# Patient Record
Sex: Female | Born: 1970 | Race: White | Hispanic: Yes | Marital: Married | Smoking: Never smoker
Health system: Southern US, Community
[De-identification: ages and names within clinical notes are randomized; demographics above are authoritative.]

## PROBLEM LIST (undated history)

## (undated) DIAGNOSIS — E119 Type 2 diabetes mellitus without complications: Secondary | ICD-10-CM

## (undated) DIAGNOSIS — O24419 Gestational diabetes mellitus in pregnancy, unspecified control: Secondary | ICD-10-CM

## (undated) DIAGNOSIS — E785 Hyperlipidemia, unspecified: Secondary | ICD-10-CM

## (undated) DIAGNOSIS — K760 Fatty (change of) liver, not elsewhere classified: Secondary | ICD-10-CM

## (undated) DIAGNOSIS — I10 Essential (primary) hypertension: Secondary | ICD-10-CM

## (undated) DIAGNOSIS — K8020 Calculus of gallbladder without cholecystitis without obstruction: Secondary | ICD-10-CM

## (undated) HISTORY — DX: Essential (primary) hypertension: I10

## (undated) HISTORY — DX: Fatty (change of) liver, not elsewhere classified: K76.0

## (undated) HISTORY — DX: Hyperlipidemia, unspecified: E78.5

## (undated) HISTORY — DX: Type 2 diabetes mellitus without complications: E11.9

## (undated) HISTORY — DX: Calculus of gallbladder without cholecystitis without obstruction: K80.20

## (undated) HISTORY — DX: Gestational diabetes mellitus in pregnancy, unspecified control: O24.419

---

## 1999-10-11 ENCOUNTER — Inpatient Hospital Stay (HOSPITAL_COMMUNITY): Admission: AD | Admit: 1999-10-11 | Discharge: 1999-10-13 | Payer: Self-pay | Admitting: Obstetrics

## 2000-10-30 ENCOUNTER — Emergency Department (HOSPITAL_COMMUNITY): Admission: EM | Admit: 2000-10-30 | Discharge: 2000-10-30 | Payer: Self-pay | Admitting: *Deleted

## 2000-10-31 ENCOUNTER — Encounter: Payer: Self-pay | Admitting: *Deleted

## 2000-12-28 ENCOUNTER — Ambulatory Visit (HOSPITAL_COMMUNITY): Admission: RE | Admit: 2000-12-28 | Discharge: 2000-12-28 | Payer: Self-pay | Admitting: Gastroenterology

## 2001-06-23 ENCOUNTER — Other Ambulatory Visit: Admission: RE | Admit: 2001-06-23 | Discharge: 2001-06-23 | Payer: Self-pay | Admitting: Obstetrics and Gynecology

## 2001-09-28 ENCOUNTER — Inpatient Hospital Stay (HOSPITAL_COMMUNITY): Admission: AD | Admit: 2001-09-28 | Discharge: 2001-09-29 | Payer: Self-pay | Admitting: Obstetrics and Gynecology

## 2004-12-05 ENCOUNTER — Inpatient Hospital Stay (HOSPITAL_COMMUNITY): Admission: AD | Admit: 2004-12-05 | Discharge: 2004-12-07 | Payer: Self-pay | Admitting: Obstetrics

## 2010-09-23 ENCOUNTER — Emergency Department (HOSPITAL_COMMUNITY): Payer: Self-pay

## 2010-09-23 ENCOUNTER — Emergency Department (HOSPITAL_COMMUNITY)
Admission: EM | Admit: 2010-09-23 | Discharge: 2010-09-23 | Disposition: A | Discharge status: Home / Self Care | Payer: Self-pay | Attending: Emergency Medicine | Admitting: Emergency Medicine

## 2010-09-23 DIAGNOSIS — Z79899 Other long term (current) drug therapy: Secondary | ICD-10-CM | POA: Insufficient documentation

## 2010-09-23 DIAGNOSIS — R112 Nausea with vomiting, unspecified: Secondary | ICD-10-CM | POA: Insufficient documentation

## 2010-09-23 DIAGNOSIS — R1013 Epigastric pain: Secondary | ICD-10-CM | POA: Insufficient documentation

## 2010-09-23 DIAGNOSIS — I10 Essential (primary) hypertension: Secondary | ICD-10-CM | POA: Insufficient documentation

## 2010-09-23 DIAGNOSIS — M25519 Pain in unspecified shoulder: Secondary | ICD-10-CM | POA: Insufficient documentation

## 2010-09-23 LAB — COMPREHENSIVE METABOLIC PANEL
ALT: 60 U/L — ABNORMAL HIGH (ref 0–35)
AST: 34 U/L (ref 0–37)
Albumin: 4.1 g/dL (ref 3.5–5.2)
Alkaline Phosphatase: 95 U/L (ref 39–117)
BUN: 15 mg/dL (ref 6–23)
CO2: 26 mEq/L (ref 19–32)
Calcium: 9.8 mg/dL (ref 8.4–10.5)
Chloride: 103 mEq/L (ref 96–112)
Creatinine, Ser: 0.57 mg/dL (ref 0.50–1.10)
GFR calc Af Amer: >60 mL/min (ref >60)
GFR calc non Af Amer: >60 mL/min (ref >60)
Glucose, Bld: 136 mg/dL — ABNORMAL HIGH (ref 70–99)
Potassium: 3.3 mEq/L — ABNORMAL LOW (ref 3.5–5.1)
Sodium: 140 mEq/L (ref 135–145)
Total Bilirubin: 2.3 mg/dL — ABNORMAL HIGH (ref 0.3–1.2)
Total Protein: 7.8 g/dL (ref 6.0–8.3)

## 2010-09-23 LAB — CBC
HCT: 42 % (ref 36.0–46.0)
Hemoglobin: 14.4 g/dL (ref 12.0–15.0)
MCH: 31.2 pg (ref 26.0–34.0)
MCHC: 34.3 g/dL (ref 30.0–36.0)
MCV: 90.9 fL (ref 78.0–100.0)
Platelets: 318 10*3/uL (ref 150–400)
RBC: 4.62 MIL/uL (ref 3.87–5.11)
RDW: 12.8 % (ref 11.5–15.5)
WBC: 11.2 10*3/uL — ABNORMAL HIGH (ref 4.0–10.5)

## 2010-09-23 LAB — DIFFERENTIAL
Basophils Absolute: 0 10*3/uL (ref 0.0–0.1)
Basophils Relative: 0 % (ref 0–1)
Eosinophils Absolute: 0.1 10*3/uL (ref 0.0–0.7)
Eosinophils Relative: 1 % (ref 0–5)
Lymphocytes Relative: 30 % (ref 12–46)
Lymphs Abs: 3.3 10*3/uL (ref 0.7–4.0)
Monocytes Absolute: 0.8 10*3/uL (ref 0.1–1.0)
Monocytes Relative: 7 % (ref 3–12)
Neutro Abs: 7 10*3/uL (ref 1.7–7.7)
Neutrophils Relative %: 63 % (ref 43–77)

## 2010-09-23 LAB — LIPASE, BLOOD: Lipase: 32 U/L (ref 11–59)

## 2010-10-04 ENCOUNTER — Emergency Department (HOSPITAL_COMMUNITY): Payer: Self-pay

## 2010-10-04 ENCOUNTER — Emergency Department (HOSPITAL_COMMUNITY)
Admission: EM | Admit: 2010-10-04 | Discharge: 2010-10-04 | Disposition: A | Discharge status: Home / Self Care | Payer: Self-pay | Attending: Emergency Medicine | Admitting: Emergency Medicine

## 2010-10-04 DIAGNOSIS — Z79899 Other long term (current) drug therapy: Secondary | ICD-10-CM | POA: Insufficient documentation

## 2010-10-04 DIAGNOSIS — M25519 Pain in unspecified shoulder: Secondary | ICD-10-CM | POA: Insufficient documentation

## 2010-10-04 DIAGNOSIS — N83209 Unspecified ovarian cyst, unspecified side: Secondary | ICD-10-CM | POA: Insufficient documentation

## 2010-10-04 DIAGNOSIS — I10 Essential (primary) hypertension: Secondary | ICD-10-CM | POA: Insufficient documentation

## 2010-10-04 DIAGNOSIS — R1033 Periumbilical pain: Secondary | ICD-10-CM | POA: Insufficient documentation

## 2010-10-04 DIAGNOSIS — R1110 Vomiting, unspecified: Secondary | ICD-10-CM | POA: Insufficient documentation

## 2010-10-04 LAB — COMPREHENSIVE METABOLIC PANEL
ALT: 62 U/L — ABNORMAL HIGH (ref 0–35)
AST: 41 U/L — ABNORMAL HIGH (ref 0–37)
Albumin: 4.1 g/dL (ref 3.5–5.2)
CO2: 26 mEq/L (ref 19–32)
Calcium: 9.5 mg/dL (ref 8.4–10.5)
Chloride: 99 mEq/L (ref 96–112)
Creatinine, Ser: <0.47 mg/dL — ABNORMAL LOW (ref 0.50–1.10)
Sodium: 136 mEq/L (ref 135–145)
Total Bilirubin: 2.3 mg/dL — ABNORMAL HIGH (ref 0.3–1.2)

## 2010-10-04 LAB — URINALYSIS, ROUTINE W REFLEX MICROSCOPIC
Glucose, UA: NEGATIVE mg/dL
Hgb urine dipstick: NEGATIVE
Leukocytes, UA: NEGATIVE
Protein, ur: NEGATIVE mg/dL
pH: 5.5 (ref 5.0–8.0)

## 2010-10-04 LAB — DIFFERENTIAL
Basophils Absolute: 0 10*3/uL (ref 0.0–0.1)
Basophils Relative: 0 % (ref 0–1)
Eosinophils Absolute: 0.1 10*3/uL (ref 0.0–0.7)
Neutro Abs: 5.2 10*3/uL (ref 1.7–7.7)
Neutrophils Relative %: 57 % (ref 43–77)

## 2010-10-04 LAB — POCT PREGNANCY, URINE: Preg Test, Ur: NEGATIVE

## 2010-10-04 LAB — CBC
Hemoglobin: 14.8 g/dL (ref 12.0–15.0)
Platelets: 251 10*3/uL (ref 150–400)
RBC: 4.68 MIL/uL (ref 3.87–5.11)
WBC: 9 10*3/uL (ref 4.0–10.5)

## 2010-10-04 MED ORDER — IOHEXOL 300 MG/ML  SOLN
100.0000 mL | Freq: Once | INTRAMUSCULAR | Status: AC | PRN
  Administered 2010-10-04: 100 mL via INTRAVENOUS

## 2011-01-12 NOTE — L&D Delivery Note (Signed)
Attestation of Attending Supervision of Resident: Evaluation and management procedures were performed by the Family Medicine Resident under my supervision.  I have seen and examined the patient, reviewed the resident's note and chart, and I agree with the management and plan.  Adalena Abdulla, MD, FACOG Attending Obstetrician & Gynecologist Faculty Practice, [facility]  

## 2011-01-12 NOTE — L&D Delivery Note (Signed)
Delivery Note At 5:31 PM a viable female was delivered via Vaginal, Spontaneous Delivery (Presentation: Left Occiput Anterior).  APGAR:9 , 9; weight pending.   Placenta status: Intact, Spontaneous.  Cord: 3 vessels with the following complications: None.  Cord pH: not drawn.  Anesthesia: None  Episiotomy: None Lacerations: 1st degree, perineal Suture Repair: 3.0 Monocryl Est. Blood Loss (mL): 350  Mom to postpartum.  Baby to nursery-stable.  [name] 12/27/2011, 6:00 PM

## 2011-10-20 ENCOUNTER — Ambulatory Visit (INDEPENDENT_AMBULATORY_CARE_PROVIDER_SITE_OTHER): Payer: Self-pay | Admitting: Advanced Practice Midwife

## 2011-10-20 ENCOUNTER — Encounter: Payer: Self-pay | Admitting: Advanced Practice Midwife

## 2011-10-20 VITALS — BP 145/91 | Temp 96.9°F | Ht 62.0 in | Wt 181.5 lb

## 2011-10-20 DIAGNOSIS — Z3480 Encounter for supervision of other normal pregnancy, unspecified trimester: Secondary | ICD-10-CM

## 2011-10-20 DIAGNOSIS — O0990 Supervision of high risk pregnancy, unspecified, unspecified trimester: Secondary | ICD-10-CM | POA: Insufficient documentation

## 2011-10-20 DIAGNOSIS — O0930 Supervision of pregnancy with insufficient antenatal care, unspecified trimester: Secondary | ICD-10-CM

## 2011-10-20 DIAGNOSIS — IMO0002 Reserved for concepts with insufficient information to code with codable children: Secondary | ICD-10-CM

## 2011-10-20 DIAGNOSIS — O09529 Supervision of elderly multigravida, unspecified trimester: Secondary | ICD-10-CM

## 2011-10-20 LAB — POCT URINALYSIS DIP (DEVICE)
Glucose, UA: NEGATIVE mg/dL
Ketones, ur: NEGATIVE mg/dL
Specific Gravity, Urine: >=1.03 (ref 1.005–1.030)

## 2011-10-20 NOTE — Progress Notes (Signed)
Pulse- 110 BP Recheck 128/79 Weight gain of 11-20lbs  Given new ob packet  Given flu vaccine info.  Declines WIC info.

## 2011-10-20 NOTE — Addendum Note (Signed)
Addended by: Franchot Mimes on: 10/20/2011 10:38 AM   Modules accepted: Orders

## 2011-10-20 NOTE — Progress Notes (Signed)
   Subjective:    Jasmine Vasquez is a [id number] [redacted]w[redacted]d being seen today for her first obstetrical visit.  Her obstetrical history is significant for advanced maternal age and gestational diabetes in one previous pregnancy. Patient does intend to breast feed. Pregnancy history fully reviewed.  Patient reports no complaints.  Filed Vitals:   10/20/11 0832 10/20/11 0836  BP: 145/91   Temp: 96.9 F (36.1 C)   Height:  5\' 2"  (1.575 m)  Weight: 82.328 kg (181 lb 8 oz)     HISTORY: OB History    Grav Para Term Preterm Abortions TAB SAB Ect Mult Living   4 3 3       3      # Outc Date GA Lbr Len/2nd Wgt Sex Del Anes PTL Lv   1 TRM 9/01 [redacted]w[redacted]d  6lb5oz(2.863kg) F SVD None  Yes   2 TRM 9/03 [redacted]w[redacted]d  9lb(4.082kg) F SVD None  Yes   3 TRM 11/06 [redacted]w[redacted]d  6lb5oz(2.863kg) F SVD None  Yes   4 CUR              Past Medical History  Diagnosis Date  . Hypertension   . Gestational diabetes     third pregnancy   History reviewed. No pertinent past surgical history. Family History  Problem Relation Age of Onset  . Hypertension Father   . Hypertension Brother      Exam    Uterus:     Pelvic Exam:    Perineum: No Hemorrhoids, Normal Perineum   Vulva: normal   Vagina:  normal mucosa, normal discharge   pH:    Cervix: multiparous appearance, no bleeding following Pap, no cervical motion tenderness and no lesions   Adnexa: not easily palpable at this gestation   Bony Pelvis: average and gynecoid  System: Breast:  normal appearance, no masses or tenderness   Skin: normal coloration and turgor, no rashes    Neurologic: oriented, normal, normal mood, gait normal; reflexes normal and symmetric   Extremities: normal strength, tone, and muscle mass   HEENT PERRLA   Mouth/Teeth mucous membranes moist, pharynx normal without lesions   Neck supple and no masses   Cardiovascular: regular rate and rhythm   Respiratory:  appears well, vitals normal, no respiratory distress, acyanotic, normal RR, ear  and throat exam is normal, neck free of mass or lymphadenopathy, chest clear, no wheezing, crepitations, rhonchi, normal symmetric air entry   Abdomen: soft, non-tender; bowel sounds normal; no masses,  no organomegaly   Urinary: urethral meatus normal      Assessment:    Pregnancy: [id number] Patient Active Problem List  Diagnosis  . Supervision of normal subsequent pregnancy  . Late prenatal care  . Advanced maternal age (AMA) in pregnancy        Plan:     Initial labs drawn. Prenatal vitamins. Problem list reviewed and updated. Pap, OB panel, Glucose screen done at today's visit Genetic Screening discussed available options including [facility] and [facility] because late to care: declined except [facility].  Ultrasound discussed; fetal survey: ordered.  Follow up in 2 weeks. 75% of 30 min visit spent on counseling and coordination of care.     LEFTWICH-KIRBY, Trenese Haft 10/20/2011

## 2011-10-20 NOTE — Patient Instructions (Signed)
Embarazo  Tercer trimestre  (Pregnancy - Third Trimester) El tercer trimestre del embarazo (los ltimos 3 meses) es el perodo en el cual tanto usted como su beb crecen con ms rapidez. El beb alcanza un largo de aproximadamente 50 cm. y pesa entre 2,700 y 4,500 kg. El beb gana ms tejido graso y est listo para la vida fuera del cuerpo de la madre. Mientras estn en el interior, los bebs tienen perodos de sueo y vigilia, succionan el pulgar y tienen hipo. Quizs sienta pequeas contracciones del tero. Este es el falso trabajo de parto. Tambin se las conoce como contracciones de Braxton-Hicks . Es como una prctica del parto. Los problemas ms habituales de esta etapa del embarazo incluyen mayor dificultad para respirar, hinchazn de las manos y los pies por retencin de lquidos y la necesidad de orinar con ms frecuencia debido a que el tero y el beb presionan sobre la vejiga.  EXAMENES PRENATALES   Durante los exmenes prenatales, deber seguir realizndose anlisis de sangre. Estas pruebas se realizan para controlar su salud y la del beb. Los anlisis de sangre se realizan para conocer los niveles de algunos compuestos de la sangre (hemoglobina). La anemia (bajo nivel de hemoglobina) es frecuente durante el embarazo. Para prevenirla, se administran hierro y vitaminas. Tambin le tomarn nuevas anlisis para descartar diabetes. Podrn repetirle algunas de las pruebas que le hicieron previamente.  En cada visita le medirn el tamao del tero. Esto permite asegurar que el beb se desarrolla adecuadamente, segn la fecha del embarazo.  Le controlarn la presin arterial en cada visita prenatal. Esto es para asegurarse de que no sufre toxemia.  Le harn un anlisis de orina en cada visita prenatal, para descartar infecciones, diabetes y la presencia de protenas.  Tambin en cada visita controlarn su peso. Esto se realiza para asegurarse que aumenta de peso al ritmo indicado y que usted y su  beb evolucionan normalmente.  En algunas ocasiones se realiza una prueba de ultrasonido para confirmar el correcto desarrollo y evolucin del beb. Esta prueba se realiza con ondas sonoras inofensivas para el beb, de modo que el profesional pueda calcular ms precisamente la fecha del parto.  Analice con su mdico los analgsicos y la anestesia que recibir durante el trabajo de parto y el parto.  Comente la posibilidad de que necesite una cesrea y qu anestesia se recibir.  Informe a su mdico si sufre violencia familiar mental o fsica. A veces, se indica la prueba especializada sin estrs, la prueba de tolerancia a las contracciones y el perfil biofsico para asegurarse de que el beb no tiene problemas. El estudio del lquido amnitico que rodea al beb se llama amniocentesis. El lquido amnitico se obtiene introduciendo una aguja en el vientre (abdomen ). En ocasiones se lleva a cabo cerca del final del embarazo, si es necesario inducir a un parto. En este caso se realiza para asegurarse que los pulmones del beb estn lo suficientemente maduros como para que pueda vivir fuera del tero. Si los pulmones no han madurado y es peligroso que el beb nazca, se administrar a la madre una inyeccin de cortisona , 1 a 2 das antes del parto. . Esto ayuda a que los pulmones del beb maduren y sea ms seguro su nacimiento.  CAMBIOS QUE OCURREN EN EL TERCER TRIMESTRE DEL EMBARAZO  Su organismo atravesar numerosos cambios durante el embarazo. Estos pueden variar de una persona a otra. Converse con el profesional que la asiste acerca los cambios que   usted note y que la preocupen.   Durante el ltimo trimestre probablemente sienta un aumento del apetito. Es normal tener "antojos" de ciertas comidas. Esto vara de una persona a otra y de un embarazo a otro.  Podrn aparecer las primeras estras en las caderas, abdomen y mamas. Estos son cambios normales del cuerpo durante el embarazo. No existen  medicamentos ni ejercicios que puedan prevenir estos cambios.  La constipacin puede tratarse con un laxante o agregando fibra a su dieta. Beber grandes cantidades de lquidos, tomar fibras en forma de vegetales, frutas y granos integrales es de gran ayuda.  Tambin es beneficioso practicar actividad fsica. Si ha sido una persona activa hasta el embarazo, podr continuar con la mayora de las actividades durante el mismo. Si ha sido menos activa, puede ser beneficioso que comience con un programa de ejercicios, como realizar caminatas. Consulte con el profesional que la asiste antes de comenzar un programa de ejercicios.  Evite el consumo de cigarrillos, el alcohol, los medicamentos no recetados y las "drogas de la calle" durante el embarazo. Estas sustancias qumicas afectan la formacin y el desarrollo del beb. Evite estas sustancias durante todo el embarazo para asegurar el nacimiento de un beb sano.  Podr sentir dolor de espalda, tener vrices en las venas y hemorroides, o si ya los sufra, pueden empeorar.  Durante el tercer trimestre se cansar con ms facilidad, lo cual es normal.  Los movimientos del beb pueden ser ms fuertes y con ms frecuencia.  Puede que note dificultades para respirar normalmente.  El ombligo puede salir hacia afuera.  A veces sale una secrecin amarilla de las mamas, que se llama calostro.  Podr aparecer una secrecin mucosa con sangre. Esto suele ocurrir entre unos pocos das y una semana antes del parto. INSTRUCCIONES PARA EL CUIDADO EN EL HOGAR   Cumpla con las citas de control. Siga las indicaciones del mdico con respecto al uso de medicamentos, los ejercicios y la dieta.  Durante el embarazo debe obtener nutrientes para usted y para su beb. Consuma alimentos balanceados a intervalos regulares. Elija alimentos como carne, pescado, leche y otros productos lcteos descremados, vegetales, frutas, panes integrales y cereales. El mdico le informar  cul es el aumento de peso ideal.  Las relaciones sexuales pueden continuarse hasta casi el final del embarazo, si no se presentan otros problemas como prdida prematura (antes de tiempo) de lquido amnitico, hemorragia vaginal o dolor en el vientre (abdominal).  Realice actividad fsica todos los das, si no tiene restricciones. Consulte con el profesional que la asiste si no sabe con certeza si determinados ejercicios son seguros. El mayor aumento de peso se producir en los ltimos 2 trimestres del embarazo. El ejercicio ayuda a:  Controlar su peso.  Mantenerse en forma para el trabajo de parto y el parto .  Perder peso despus del parto.  Haga reposo con frecuencia, con las piernas elevadas, o segn lo necesite para evitar los calambres y el dolor de cintura.  Use un buen sostn o como los que se usan para hacer deportes para aliviar la sensibilidad de las mamas. Tambin puede serle til si lo usa mientras duerme. Si pierde calostro, podr utilizar apsitos en el sostn.  No utilice la baera con agua caliente, baos turcos y saunas.  Colquese el cinturn de seguridad cuando conduzca. Este la proteger a usted y al beb en caso de accidente.  Evite comer carne cruda y el contacto con los utensilios y desperdicios de los gatos. Estos elementos   contienen grmenes que pueden causar defectos de nacimiento en el beb.  Es fcil perder algo de orina durante el embarazo. Apretar y fortalecer los msculos de la pelvis la ayudar con este problema. Practique detener la miccin cuando est en el bao. Estos son los mismos msculos que necesita fortalecer. Son tambin los mismos msculos que utiliza cuando trata de evitar despedir gases. Puede practicar apretando estos msculos diez veces, y repetir esto tres veces por da aproximadamente. Una vez que conozca qu msculos debe apretar, no realice estos ejercicios durante la miccin. Puede favorecerle una infeccin si la orina vuelve hacia  atrs.  Pida ayuda si tienen necesidades financieras, teraputicas o nutricionales. El profesional podr ayudarla con respecto a estas necesidades, o derivarla a otros especialistas.  Haga una lista de nmeros telefnicos de emergencia y tngalos disponibles.  Planifique como obtener ayuda de familiares o amigos cuando regrese a casa desde el hospital.  Hacer un ensayo sobre la partida al hospital.  Tome clases prenatales con el padre para entender, practicar y hacer preguntas sobre el trabajo de parto y el alumbramiento.  Preparar la habitacin del beb / busque una guardera.  No viaje fuera de la ciudad a menos que sea absolutamente necesario y con el asesoramiento de su mdico.  Use slo zapatos de tacn bajo o sin tacn para tener mejor equilibrio y evitar cadas. USO DE MEDICAMENTOS Y CONSUMO DE DROGAS DURANTE EL EMBARAZO   Tome las vitaminas apropiadas para esta etapa tal como se le indic. Las vitaminas deben contener un miligramo de cido flico. Guarde todas las vitaminas fuera del alcance de los nios. La ingestin de slo un par de vitaminas o tabletas que contengan hierro pueden ocasionar la muerte en un beb o en un nio pequeo.  Evite el uso de todos los medicamentos, incluyendo hierbas, medicamentos de venta libre, sin receta o que no hayan sido sugeridos por su mdico. Slo tome medicamentos de venta libre o medicamentos recetados para el dolor, el malestar o fiebre como lo indique su mdico. No tome aspirina, ibuprofeno (Motrin, Advil, Nuprin) o naproxeno (Aleve) excepto que su mdico se lo indique.  Infrmele al profesional si consume alguna droga.  El alcohol se relaciona con ciertos defectos congnitos. Incluye el sndrome de alcoholismo fetal. Debe evitar absolutamente el consumo de alcohol, en cualquier forma. El fumar produce baja tasa de natalidad y bebs prematuros.  Las drogas ilegales o de la calle son muy perjudiciales para el beb. Estn absolutamente  prohibidas. Un beb que nace de una madre adicta, ser adicto al nacer. Ese beb tendr los mismos sntomas de abstinencia que un adulto. SOLICITE ATENCIN MDICA SI:  Tiene preguntas o preocupaciones relacionadas con el embarazo. Es mejor que llame para formular las preguntas si no puede esperar hasta la prxima visita, que sentirse preocupada por ellas.  DECISIONES ACERCA DE LA CIRCUNCISIN  Usted puede saber o no cul es el sexo de su beb. Si ya sabe que ser un varn, este es el momento de pensar acerca de la circuncisin. La circuncisin es la extirpacin del prepucio. Esta es la piel que cubre el extremo sensible del pene. No hay un motivo mdico que lo justifique. Generalmente la decisin se toma segn lo que sea popular en ese momento, o segn creencias religiosas. Podr conversar estos temas con su mdico o con el pediatra.  SOLICITE ATENCIN MDICA DE INMEDIATO SI:   La temperatura oral le sube a ms de 102 F (38.9 C) o lo que su mdico le   indique.  Tiene una prdida de lquido por la vagina (canal de parto). Si sospecha una ruptura de las membranas, tmese la temperatura y llame al profesional para informarlo sobre esto.  Observa unas pequeas manchas, una hemorragia vaginal o elimina cogulos. Notifique al profesional acerca de la cantidad y de cuntos apsitos est utilizando.  Presenta un olor desagradable en la secrecin vaginal y observa un cambio en el color, de transparente a blanco.  Ha vomitado durante ms de 24 horas.  Siente escalofros o le sube la fiebre.  Le falta el aire.  Siente ardor al orinar.  Baja o sube ms de 2 libras (900 g), o segn lo indicado por el profesional que la asiste.  Observa que sbitamente se le hinchan el rostro, las manos, los pies o las piernas.  Siente dolor en el vientre (abdominal). Las molestias en el ligamento redondo son una causa benigna frecuente de dolor abdominal durante el embarazo. El profesional que la asiste deber  evaluarla.  Presenta dolor de cabeza intenso que no se alivia.  Tiene problemas visuales, visin doble o borrosa.  Si no siente los movimientos del beb durante ms de 1 hora. Si piensa que el beb no se mueve tanto como lo haca habitualmente, coma algo que contenga azcar y recustese sobre el lado izquierdo durante una hora. El beb debe moverse al menos 4  5 veces por hora. Comunquese inmediatamente si el beb se mueve menos que lo indicado.  Se cae, se ve involucrada en un accidente automovilstico o sufre algn tipo de traumatismo.  En su hogar hay violencia mental o fsica. Document Released: 10/07/2004 Document Revised: 06/29/2011 [employer] Patient Information 2013 [employer].  

## 2011-10-21 LAB — OBSTETRIC PANEL
Antibody Screen: NEGATIVE
Eosinophils Absolute: 0.1 10*3/uL (ref 0.0–0.7)
Eosinophils Relative: 1 % (ref 0–5)
HCT: 39.2 % (ref 36.0–46.0)
Hemoglobin: 13.4 g/dL (ref 12.0–15.0)
Lymphocytes Relative: 14 % (ref 12–46)
Lymphs Abs: 1.5 10*3/uL (ref 0.7–4.0)
MCH: 30.7 pg (ref 26.0–34.0)
MCV: 89.7 fL (ref 78.0–100.0)
Monocytes Absolute: 0.3 10*3/uL (ref 0.1–1.0)
Monocytes Relative: 3 % (ref 3–12)
RBC: 4.37 MIL/uL (ref 3.87–5.11)
Rh Type: POSITIVE
Rubella: 13.4 IU/mL — ABNORMAL HIGH
WBC: 10.8 10*3/uL — ABNORMAL HIGH (ref 4.0–10.5)

## 2011-10-21 LAB — GLUCOSE TOLERANCE, 1 HOUR (50G) W/O FASTING: Glucose, 1 Hour GTT: 195 mg/dL — ABNORMAL HIGH (ref 70–140)

## 2011-10-25 ENCOUNTER — Telehealth: Payer: Self-pay | Admitting: *Deleted

## 2011-10-25 NOTE — Telephone Encounter (Signed)
Message copied by Gerome Apley on Mon Oct 25, 2011  9:18 AM ------      Message from: Odelia Gage A      Created: Mon Oct 25, 2011  7:22 AM       Appointment is Tuesday at 8:00                  ----- Message -----         From: Hurshel Party, CNM         Sent: 10/22/2011  12:23 PM           To: [facility] Admin Pool            Pt had 195 on 1 hour glucose tolerance test.  Please call her to have her come in for 3 hour test done as soon as possible.  We can coordinate regular prenatal visit with lab but she should be seen in next 2 weeks.  Thank you.

## 2011-10-25 NOTE — Telephone Encounter (Signed)
Message sent to admin pool to reschedule patient's 3 hour GTT to Friday morning. Patient has [place] tomorrow that would interfere with 3 hour lab draw. Patient needs to be notified with Spanish interpreter.

## 2011-10-25 NOTE — Telephone Encounter (Signed)
Called patient with Jasmine Vasquez and informed her of lab appointment on Friday at 8:00 am. Patient was advised that 1 hour GTT was elevated and there was a need for the 3 hour appointment. The patient voiced understanding and did not have any further questions.

## 2011-10-25 NOTE — Telephone Encounter (Signed)
Need to call patient with Spanish interpreter.  

## 2011-10-26 ENCOUNTER — Other Ambulatory Visit: Payer: Self-pay

## 2011-10-26 ENCOUNTER — Other Ambulatory Visit: Payer: Self-pay | Admitting: Advanced Practice Midwife

## 2011-10-26 ENCOUNTER — Ambulatory Visit (HOSPITAL_COMMUNITY)
Admission: RE | Admit: 2011-10-26 | Discharge: 2011-10-26 | Disposition: A | Discharge status: Home / Self Care | Payer: Self-pay | Source: Ambulatory Visit | Attending: Advanced Practice Midwife | Admitting: Advanced Practice Midwife

## 2011-10-26 DIAGNOSIS — Z3480 Encounter for supervision of other normal pregnancy, unspecified trimester: Secondary | ICD-10-CM

## 2011-10-26 DIAGNOSIS — O09299 Supervision of pregnancy with other poor reproductive or obstetric history, unspecified trimester: Secondary | ICD-10-CM | POA: Insufficient documentation

## 2011-10-26 DIAGNOSIS — IMO0002 Reserved for concepts with insufficient information to code with codable children: Secondary | ICD-10-CM

## 2011-10-26 DIAGNOSIS — O0930 Supervision of pregnancy with insufficient antenatal care, unspecified trimester: Secondary | ICD-10-CM

## 2011-10-26 DIAGNOSIS — O09529 Supervision of elderly multigravida, unspecified trimester: Secondary | ICD-10-CM | POA: Insufficient documentation

## 2011-10-29 ENCOUNTER — Other Ambulatory Visit: Payer: Self-pay

## 2011-10-29 DIAGNOSIS — R7309 Other abnormal glucose: Secondary | ICD-10-CM

## 2011-10-30 LAB — GLUCOSE TOLERANCE, 3 HOURS: Glucose, GTT - 3 Hour: 115 mg/dL (ref 70–144)

## 2011-11-03 ENCOUNTER — Encounter: Payer: Self-pay | Admitting: Family Medicine

## 2011-11-03 ENCOUNTER — Ambulatory Visit (INDEPENDENT_AMBULATORY_CARE_PROVIDER_SITE_OTHER): Payer: Self-pay | Admitting: Family Medicine

## 2011-11-03 VITALS — BP 138/91 | Temp 97.1°F | Wt 181.0 lb

## 2011-11-03 DIAGNOSIS — O139 Gestational [pregnancy-induced] hypertension without significant proteinuria, unspecified trimester: Secondary | ICD-10-CM

## 2011-11-03 DIAGNOSIS — O99810 Abnormal glucose complicating pregnancy: Secondary | ICD-10-CM

## 2011-11-03 DIAGNOSIS — O169 Unspecified maternal hypertension, unspecified trimester: Secondary | ICD-10-CM

## 2011-11-03 DIAGNOSIS — O24419 Gestational diabetes mellitus in pregnancy, unspecified control: Secondary | ICD-10-CM

## 2011-11-03 DIAGNOSIS — O09529 Supervision of elderly multigravida, unspecified trimester: Secondary | ICD-10-CM

## 2011-11-03 DIAGNOSIS — O0930 Supervision of pregnancy with insufficient antenatal care, unspecified trimester: Secondary | ICD-10-CM

## 2011-11-03 LAB — POCT URINALYSIS DIP (DEVICE)
Protein, ur: 30 mg/dL — AB
Specific Gravity, Urine: 1.01 (ref 1.005–1.030)
Urobilinogen, UA: 0.2 mg/dL (ref 0.0–1.0)

## 2011-11-03 NOTE — Progress Notes (Signed)
Elev BP:  145/91 last visit 138/91 today.  No HA, vision changes, RUQ pain.  Failed 3 hour GTT. Pt had GDM last pregnancy.  PIH labs, 24 hour urine protein ordered. Will bring on Monday. Transfer to [facility] with appt next week to see Diabetic educator, nutritionist. Discussed dietary changes - pt remembers from last pregnancy. Does not have glucometer. Will get set up next visit.

## 2011-11-03 NOTE — Patient Instructions (Signed)
Diabetes mellitus gestacional (Gestational Diabetes Mellitus) La diabetes mellitus gestacional se produce slo durante el embarazo. Aparece cuando el organismo no puede controlar adecuadamente la glucosa (azcar) que aumenta en la sangre despus de comer. Durante el embarazo, se produce una resistencia a la insulina (sensibilidad reducida a la insulina) debido a la liberacin de hormonas por parte de la placenta. Generalmente, el pncreas de una mujer embarazada produce la cantidad suficiente de insulina para vencer esa resistencia. Sin embargo, en la diabetes gestacional, hay insulina pero no cumple su funcin adecuadamente. Si la resistencia es lo suficientemente grave como para que el pncreas no produzca la cantidad de insulina suficiente, la glucosa extra se acumula en la sangre.  QUINES TIENEN RIESGO DE DESARROLLAR DIABETES GESTACIONAL?  Las mujeres con historia de diabetes en la familia.  Las mujeres de ms de 25 aos.  Las que presentan sobrepeso.  Las mujeres que pertenecen a ciertos grupos tnicos (latinas, afroamericanas, norteamericanas nativas, asiticas y las originarias de las islas del Pacfico. QUE PUEDE OCURRIRLE AL BEB? Si el nivel de glucosa en sangre de la madre es demasiado elevado mientras este embarazada, el nivel extra de azcar pasar por el cordn umbilical hacia el beb. Algunos de los problemas del beb pueden ser:  Beb demasiado grande: si el nio recibe demasiada azcar, puede aumentar mucho de peso. Esto puede hacer que sea demasiado grande para nacer por parto normal (vaginal) por lo que ser necesario realizar una cesrea.  Bajo nivel de glucosa (hipoglucemia): el beb produce insulina extra en respuesta a la excesiva cantidad de azcar que obtiene de la madre. Cuando el beb nace y ya no necesita insulina extra, su nivel de azcar en sangre puede disminuir.  Ictericia (coloracin amarillenta de la piel y los ojos): esto es bastante frecuente en los bebs. La  causa es la acumulacin de una sustancia qumica denominada bilirrubina. No siempre es un trastorno grave, pero se observa con frecuencia en los bebs cuyas madres sufren diabetes gestacional. RIESGOS PARA LA MADRE Las mujeres que han sufrido diabetes gestacional pueden tener ms riesgos para algunos problemas como:  Preeclampsia o toxemia, incluyendo problemas con hipertensin arterial. La presin arterial y los niveles de protenas en la orina deben controlarse con frecuencia.  Infecciones  Parto por cesrea.  Aparicin de diabetes tipo 2 en una etapa posterior de la vida. Alrededor del 30% al 50% sufrir diabetes posteriormente, especialmente las que son obesas. DIAGNSTICO Las hormonas que causan resistencia a la insulina tienen su mayor nivel alrededor de las 24 a 28 semanas del embarazo. Si se experimentan sntomas, stos son similares a los sntomas que normalmente aparecen durante el embarazo.  La diabetes mellitus gestacional generalmente se diagnostica por medio de un mtodo en dos partes: 1. Despus de la 24 a 28 semanas de embarazo, la mujer debe beber una solucin que contiene glucosa y realizar un anlisis de sangre. Si el nivel de glucosa es elevado, la realizarn un segundo anlisis. 2. La prueba oral de tolerancia a la glucosa, que dura aproximadamente tres horas. Despus de realizar ayuno durante la noche, se controla nivel de glucosa en sangre. La mujer bebe una solucin que contiene glucosa y le realizan anlisis de glucosa en sangre cada hora. Si la mujer tiene factores de riesgos para la diabetes mellitus gestacional, el mdico podr indicar el anlisis antes de las 24 semanas de embarazo. TRATAMIENTO El tratamiento est dirigido a mantener la glucosa en sangre de la madre en un nivel normal y puede incluir:  La   planificacin de los alimentos.  Recibir insulina u otro medicamento para Sales executive nivel de glucosa en [place].  La prctica de ejercicios.  Llevar un  registro diario de los alimentos que consume.  Control y Engineer, maintenance (IT) de los niveles de glucosa en [place].  Control de los niveles de cetona en la [place], [place] esto ya no se considera necesario en la mayora de los [place]. INSTRUCCIONES PARA EL CUIDADO DOMICILIARIO Mientras est embarazada:  Siga los consejos de su mdico relacionados con los controles prenatales, la planificacin de la comida, la actividad fsica, los [place], vitaminas, los anlisis de sangre y otras pruebas y las actividades fsicas.  Lleve un registro de las comidas, las pruebas de glucosa en sangre y la cantidad de insulina que recibe (si corresponde). Muestre todo al profesional en cada consulta mdica prenatal.  Si sufre diabetes mellitus gestacional, podr tener problemas de hipoglucemia (nivel bajo de glucosa en sangre). Podr sospechar este problema si se siente repentinamente mareada, tiene temblores y/o se siente dbil. Si cree que esto le est ocurriendo, y tiene un medidor de glucosa, mida su nivel de Event organiser. Siga los consejos de su mdico sobre el modo y el momento de tratar su nivel de glucosa en sangre. Generalmente se sigue la regla 15:15 Consuma 15 g de hidratos de carbono, espere 15 minutos y Programmer, systems el nivel de glucosa en [place].Barbara Cower de 15 g de hidratos de carbono son:  1 taza de [employer].   taza de jugo.  3-4 tabletas de glucosa.  5-6 caramelos duros.  1 caja pequea de pasas de uva.   taza de gaseosa comn.  Mantenga una buena higiene para evitar infecciones.  No fume. SOLICITE ATENCIN MDICA SI:  Observa prdida vaginal con o sin picazn.  Se siente ms dbil o cansada que lo habitual.  Primus [place]ravo.  Tiene un aumento de peso repentino, 2,5 kg o ms en una semana.  Pierde peso, 1.5 kg o ms en una semana.  Su nivel de glucosa en sangre es elevado, necesita instrucciones. SOLICITE ATENCIN MDICA DE INMEDIATO SI:  Sufre una cefalea  intensa.  Se marea o pierde el conocimiento  Presenta nuseas o vmitos.  Se siente desorientada confundida.  Sufre convulsiones.  Tiene problemas de visin.  Siente Physiological scientist.  Presenta una hemorragia vaginal abundante.  Tiene contracciones uterinas.  Tiene una prdida importante de lquido por la vagina DESPUS QUE NACE EL BEB:  Concurra a todos los controles de seguimiento y Clinical biochemist los anlisis de sangre segn las indicaciones de su mdico.  Mantenga un estilo de vida saludable para evitar la diabetes en el futuro. Aqu se incluye:  Siga el plan de alimentacin saludable.  Controle su peso.  Practique actividad fsica y descanse lo necesario.  No fume.  Amamante a su beb mientras pueda. Esto disminuir la probabilidad de que usted y su beb sufran diabetes posteriormente. Para ms informacin acerca de la diabetes, visite la pgina web de Holiday representative Diabetes Association: [email or web address].cy. Para ms informacin acerca de la diabetes gestacional cite la pgina web del [employer] of Obstetricians and Gynecologists en: [email or web address]. Document Released: 10/07/2004 Document Revised: 03/22/2011 [facility] Patient Information 2013 [place], [place].  Hipertensin durante el embarazo  (Hypertension During Pregnancy) La hipertensin tambin se denomina presin arterial alta. Puede ocurrir en cualquier momento de la vida y tambin durante el [place]. Cuando se sufre hipertensin, existe una presin extra en el interior de los vasos sanguneos que llevan la sangre desde el  corazn al resto del cuerpo (arterias). La hipertensin durante el embarazo puede causar problemas para usted y el beb. Puede ser que el beb no tenga el peso adecuado al nacer o puede nacer antes de tiempo (prematuro). En los casos muy graves de hipertensin durante el embarazo puede estar en peligro la vida.  Hay diferentes tipos de hipertensin Academic librarian.    Hipertensin crnica. Esto sucede cuando una mujer sufre de hipertensin antes del embarazo y contina durante el mismo.   Hipertensin gestacional. Es cuando se desarrolla la hipertensin Academic librarian.   Preeclampsia o toxemia del embarazo. Es un tipo muy grave de hipertensin que se desarrolla slo Academic librarian. Es una enfermedad que afecta a todo el cuerpo (sistmica) y puede ser muy peligrosa tanto para la madre como para el beb.   La hipertensin gestacional y preeclampsia por lo general desaparecen despus de nacer el beb. La presin arterial generalmente se estabiliza dentro de las 6 semanas. Las mujeres que sufren de hipertensin durante el embarazo tienen una mayor probabilidad de Environmental education officer hipertensin en etapas posteriores de la vida o en embarazos futuros.  ENTENDER LA PRESIN ARTERIAL  La presin arterial hace que se mueva la sangre en el cuerpo. A veces, la fuerza que [employer] sangre es demasiado intensa.   La lectura de la presin arterial se expresa en 2 nmeros y se ve como una fraccin.   El primer nmero es la presin sistlica. Cuando el corazn late, fuerza a que fluya ms sangre por las arterias. La presin dentro de las arterias [place].   El nmero inferior es la presin diastlica. La presin baja [employer] latidos. Eso ocurre cuando el corazn est en reposo.   Usted puede tener hipertensin si:   La presin arterial sistlica es superior a 140.   La presin diastlica es superior a 90.  FACTORES DE RIESGO  Algunos factores favorecen el desarrollo de la hipertensin Academic librarian. Los factores de riesgo son:   Sufrir hipertensin antes del Psychiatrist.   Haber sufrido hipertensin durante un embarazo anterior.   Tener sobrepeso.   Ser mayor de 40 aos.   Estar embarazada de ms de un beb (mltiples).   Tener diabetes o problemas renales.  SNTOMAS  La hipertensin gestacional y crnica pueden no causar sntomas. La preeclampsia  causa sntomas, que pueden ser:   Aumento de las protenas en la orina. El mdico va a controlar esto en cada control prenatal.   Hinchazn de las manos y la cara.   Aumento rpido de [place].   Dolores de [place].   Cambios visuales.   Molestias al ver la luz.   Dolor abdominal, especialmente en el rea superior derecha.   Dolor en el pecho.   Falta de aire.   Aumento de los reflejos.   Convulsiones. Las convulsiones ocurren en una forma ms grave de preeclampsia, llamada eclampsia.  DIAGNSTICO   Puede ser diagnosticada con hipertensin en el embarazo durante un control prenatal regular. En cada visita, las pruebas pueden ser:   Control de la presin arterial.   Anlisis de orina para detectar protenas en la orina.   El tipo de hipertensin que se diagnostica depende del momento en que se desarroll. Tambin depende de la lectura de su presin arterial especfica.   El desarrollo de hipertensin antes de las 20 semanas de embarazo es consistente con hipertensin crnica.   El desarrollo de la hipertensin despus de las 20 semanas de embarazo es consistente con  hipertensin gestacional.   Hipertensin con aumento de la protena urinaria se diagnostica como preeclampsia.   Las mediciones de la presin arterial de ms de 160 sistlica o 110 diastlica son un signo de preeclampsia grave.  TRATAMIENTO  El tratamiento para la hipertensin durante el embarazo vara. Depende del tipo de hipertensin y de su gravedad.   Si toma medicamentos para la hipertensin crnica, puede que tenga que cambiarlos.   Los medicamentos llamados inhibidores de la ECA no deben tomarse Academic librarian.   Para las mujeres que tienen factores de riesgo de preeclampsia pueden recomendarse bajas dosis de aspirina.   Si usted tiene Occupational hygienist, tendr que tomar un medicamento para la presin arterial que sea seguro durante el [place]. Su mdico le Paediatric nurse  apropiado.   Si tiene preeclampsia grave, es posible que tenga que Engineer, maintenance hospital. Los mdicos la controlarn a usted y al beb muy de cerca. Puede ser que necesite tomar medicamentos (sulfato de magnesio) para prevenir las convulsiones y reducir la presin arterial.   A veces es necesario un parto prematuro. Este puede ser el caso si el problema [place]. Se hace para protegerlos a usted y a su beb. La nica cura para la preeclampsia es el parto.  INSTRUCCIONES PARA EL CUIDADO EN EL HOGAR   Cumpla con todos los controles prenatales regulares.   Siga las indicaciones del profesional con respecto a Adult nurse. Dgale a su mdico sobre todos los [employer] toma. Incluya los medicamentos de [place].   Consuma la menor cantidad posible de sal.   Realice actividad fsica con regularidad.   No beba alcohol.   No use productos que contengan tabaco.   No tome bebidas con cafena.   Acustese sobre el lado izquierdo cuando haga reposo.   Informe a su mdico si tiene sntomas de preeclampsia.  SOLICITE ATENCIN MDICA DE INMEDIATO SI:   Siente un dolor abdominal intenso.   Observa hinchazn repentina y [employer], tobillos o el rostro.   Aumento de peso de ms de 4 libras (1,8 kg) o ms en una semana.   Vomita repetidas veces.   Presenta una hemorragia vaginal abundante.   No siente los movimientos del beb.   Sufre una cefalea grave.   Tiene visin doble o borrosa.   Tiene calambres o espasmos musculares.   Le falta el aire.   Tiene las yemas de los dedos y los labios [place].   Observa sangre en la orina.  ASEGRESE DE QUE:   Comprende estas instrucciones.   Controlar su enfermedad.   Solicitar ayuda de inmediato si no mejora o si empeora.  Document Released: 12/17/2010 [facility] Patient Information 2012 [place], [place].  Embarazo  Systems analyst trimestre  (Pregnancy - Third Trimester) El tercer trimestre del Psychiatrist (los  ltimos 3 meses) es el perodo en el cual tanto usted como su beb crecen con ms rapidez. El beb alcanza un largo de aproximadamente 50 cm. y pesa entre 2,700 y 4,500 kg. El beb gana ms tejido graso y est listo para la vida fuera del cuerpo de la [place]. Mientras estn en el interior, los bebs tienen perodos de sueo y vigilia, Warehouse manager y tienen hipo. Quizs sienta pequeas contracciones del tero. Este es el falso trabajo de [place]. Tambin se las conoce como contracciones de [place] . Es como una prctica del parto. Los problemas ms habituales de esta etapa del embarazo incluyen mayor dificultad para respirar, hinchazn de las  manos y los pies por retencin de lquidos y la necesidad de Geographical information systems officer con ms frecuencia debido a que el tero y el beb presionan sobre la vejiga.  EXAMENES PRENATALES   Durante los [employer], deber seguir realizndose anlisis de [place]. Estas pruebas se realizan para controlar su salud y la del beb. Los [employer] de sangre se Radiographer, therapeutic para [employer] niveles de algunos compuestos de la sangre (hemoglobina). La anemia (bajo nivel de hemoglobina) es frecuente durante el embarazo. Para prevenirla, se administran hierro y vitaminas. Tambin le tomarn nuevas anlisis para descartar diabetes. Podrn repetirle algunas de las Hov[employer] Enterprises hicieron previamente.  En cada visita le medirn el tamao del tero. Esto permite asegurar que el beb se desarrolla adecuadamente, segn la fecha del embarazo.  Le controlarn la presin arterial en cada visita prenatal. Esto es para asegurarse de que no sufre toxemia.  Le harn un anlisis de orina en cada visita prenatal, para descartar infecciones, diabetes y la presencia de protenas.  Tambin en cada visita controlarn su peso. Esto se realiza para asegurarse que aumenta de peso al ritmo indicado y que usted y su beb evolucionan normalmente.  En algunas ocasiones se realiza una prueba de ultrasonido para  confirmar el correcto desarrollo y evolucin del beb. Esta prueba se realiza con ondas sonoras inofensivas para el beb, de modo que el profesional pueda calcular ms precisamente la fecha del [place].  Analice con su mdico los analgsicos y la anestesia que recibir durante el [place] de parto y [place].  Comente la posibilidad de que necesite una cesrea y qu anestesia se recibir.  Informe a su mdico si sufre violencia familiar mental o fsica. A veces, se indica la prueba especializada sin estrs, la prueba de tolerancia a las contracciones y el perfil biofsico para asegurarse de que el beb no tiene problemas. El estudio del lquido amnitico que rodea al beb se llama amniocentesis. El lquido amnitico se obtiene introduciendo una aguja en el vientre (abdomen ). En ocasiones se lleva a cabo cerca del final del embarazo, si es necesario inducir a un parto. En este caso se realiza para asegurarse que los pulmones del beb estn lo suficientemente maduros como para que pueda vivir fuera del tero. Si los pulmones no han madurado y es peligroso que el beb nazca, se Building services engineer a la madre una inyeccin de [place] , 1 a 2 [address]  [address] antes del [address]. Vivia Budge ayuda a que los pulmones del beb maduren y sea ms seguro su nacimiento.  CAMBIOS QUE OCURREN EN EL TERCER TRIMESTRE DEL EMBARAZO  Su organismo atravesar numerosos cambios durante el [place]. Estos pueden variar de Neomia Dear persona a otra. Converse con el profesional que la asiste acerca los cambios que usted note y que la preocupen.   Durante el ltimo trimestre probablemente sienta un aumento del apetito. Es normal tener "antojos" de Development worker, community. Esto vara de Neomia Dear persona a otra y de un embarazo a Therapist, art.  Podrn aparecer las primeras estras en las caderas, abdomen y [place]. Estos son cambios normales del cuerpo durante el [place]. No existen medicamentos ni ejercicios que puedan prevenir [employer].  La constipacin puede tratarse con un  laxante o agregando fibra a su dieta. Beber grandes cantidades de lquidos, tomar fibras en forma de vegetales, frutas y granos integrales es de gran [place].  Tambin es beneficioso practicar actividad fsica. Si ha sido una persona Engineer, mining, podr continuar con la [employer]son de las actividades durante el mismo. Si  ha sido [employer], puede ser beneficioso que comience con un programa de ejercicios, Museum/gallery exhibitions officer. Consulte con el profesional que la asiste antes de comenzar un programa de ejercicios.  Evite el consumo de cigarrillos, el alcohol, los medicamentos no recetados y las "drogas de la calle" durante el Psychiatrist. Estas sustancias qumicas afectan la formacin y el desarrollo del beb. Evite estas sustancias durante todo el embarazo para asegurar el nacimiento de un beb sano.  Podr sentir dolor de espalda, tener vrices en las venas y hemorroides, o si ya los sufra, pueden [place].  Durante el tercer trimestre se cansar con ms facilidad, lo cual es normal.  Los movimientos del beb pueden ser ms fuertes y con ms frecuencia.  Puede que note dificultades para respirar normalmente.  El ombligo puede salir hacia afuera.  A veces sale Veterinary surgeon de las [place], que se llama Product manager.  Podr aparecer Neomia Dear secrecin mucosa con sangre. Esto suele ocurrir [employer] unos [address] y Neomia Dear semana antes del [place]. INSTRUCCIONES PARA EL CUIDADO EN EL HOGAR   Cumpla con las citas de control. Siga las indicaciones del mdico con respecto al uso de [place], los ejercicios y la dieta.  Durante el embarazo debe obtener nutrientes para usted y para su beb. Consuma alimentos balanceados a intervalos regulares. Elija alimentos como carne, pescado, [place] y otros productos lcteos descremados, vegetales, frutas, panes integrales y cereales. El [employer] informar cul es el aumento de peso ideal.  Las relaciones sexuales pueden continuarse hasta casi el final del  embarazo, si no se presentan otros problemas como prdida prematura (antes de [place]) de lquido amnitico, hemorragia vaginal o dolor en el vientre (abdominal).  Realice Tes[employer], si no tiene restricciones. Consulte con el profesional que la asiste si no sabe con certeza si determinados ejercicios son seguros. El mayor aumento de peso se producir en los ltimos 2 trimestres del Psychiatrist. El ejercicio ayuda a:  Engineering geologist.  Mantenerse en forma para el trabajo de parto y [place] .  Perder peso despus del parto.  Haga reposo con frecuencia, con las piernas elevadas, o segn lo necesite para evitar los calambres y el dolor de cintura.  Use un buen sostn o como los que se usan para hacer deportes para Paramedic la sensibilidad de las [place]. Tambin puede serle til si lo [place] mientras duerme. Si pierde Product manager, podr Parker [employer]in.  No utilice la baera con agua caliente, baos turcos y saunas.  Colquese el cinturn de seguridad cuando conduzca. Este la proteger a usted y al beb en caso de accidente.  Evite comer carne cruda y el contacto con los utensilios y desperdicios de los gatos. Estos elementos contienen grmenes que pueden causar defectos de nacimiento en el beb.  Es fcil perder algo de orina durante el [place]. Apretar y Chief Operating Officer los msculos de la pelvis la ayudar con este problema. Practique detener la miccin cuando est en el bao. Estos son los mismos msculos que Development worker, international aid. Son [employer] Partners mismos msculos que utiliza cuando trata de evitar despedir gases. Puede practicar apretando estos msculos [employer], y repetir esto tres veces por da aproximadamente. Una vez que conozca qu msculos debe apretar, no realice estos ejercicios durante la miccin. Puede favorecerle una infeccin si la orina vuelve hacia atrs.  Pida ayuda si tienen necesidades financieras, teraputicas o nutricionales. El profesional podr  ayudarla con respecto a estas necesidades, o derivarla a otros especialistas.  Maricela Curet  una lista de nmeros telefnicos de emergencia y tngalos disponibles.  Planifique como obtener ayuda de familiares o amigos cuando regrese a Programmer, applications hospital.  Hacer un ensayo sobre la partida al hospital.  Splendora clases prenatales con el padre para entender, practicar y hacer preguntas sobre el [place] de parto y el alumbramiento.  Preparar la habitacin del beb / busque Fatima Blank.  No viaje fuera de la ciudad a menos que sea absolutamente necesario y con el asesoramiento de su mdico.  Use slo zapatos de tacn bajo o sin tacn para tener mejor equilibrio y Automotive engineer cadas. USO DE MEDICAMENTOS Y CONSUMO DE DROGAS DURANTE EL [facility]   Tome las vitaminas apropiadas para esta etapa tal como se le indic. Las vitaminas deben contener un miligramo de cido flico. Guarde todas las vitaminas fuera del alcance de los nios. La ingestin de slo un par de vitaminas o tabletas que contengan hierro pueden ocasionar la [employer] en un beb o en un nio pequeo.  Evite el uso de [employer] Mutual [employer] Omaha, incluyendo hierbas, medicamentos de [place], sin receta o que no hayan sido sugeridos por su mdico. Slo tome medicamentos de venta libre o medicamentos recetados para Chief Technology Officer, Environmental health practitioner o fiebre como lo indique su mdico. No tome aspirina, ibuprofeno (Motrin, Advil, Nuprin) o naproxeno (Aleve) excepto que su mdico se lo indique.  Infrmele al profesional si consume alguna droga.  El alcohol se relaciona con ciertos defectos congnitos. Incluye el sndrome de alcoholismo fetal. Debe evitar absolutamente el consumo de alcohol, en cualquier forma. El fumar produce baja tasa de natalidad y bebs prematuros.  Las drogas ilegales o de la calle son muy perjudiciales para el beb. Estn absolutamente prohibidas. Un beb que nace de [employer], ser adicto al nacer. Ese beb tendr los mismos sntomas de  abstinencia que un adulto. SOLICITE ATENCIN MDICA SI:  Tiene preguntas o preocupaciones relacionadas con el embarazo. Es mejor que llame para formular las preguntas si no puede esperar hasta la prxima visita, que sentirse preocupada por ellas.  DECISIONES ACERCA DE LA CIRCUNCISIN  Usted puede saber o no cul es el sexo de su beb. Si ya sabe que ser un varn, este es el momento de pensar acerca de la circuncisin. La circuncisin es la extirpacin del prepucio. Esta es la piel que cubre el extremo sensible del pene. No hay un motivo mdico que lo justifique. Generalmente la decisin se toma segn lo que sea popular en ese momento, o segn creencias religiosas. Podr conversar estos temas con su mdico o con el pediatra.  SOLICITE ATENCIN MDICA DE INMEDIATO SI:   La temperatura oral le sube a ms de 102 F (38.9 C) o lo que su mdico le indique.  Tiene una prdida de lquido por la vagina (canal de parto). Si sospecha una ruptura de las [place], tmese la temperatura y llame al profesional para informarlo sobre esto.  Observa unas pequeas manchas, una hemorragia vaginal o elimina cogulos. Notifique al profesional acerca de la cantidad y de cuntos apsitos est utilizando.  Presenta un olor desagradable en la secrecin vaginal y observa un cambio en el color, de transparente a blanco.  Ha vomitado durante ms de 24 horas.  Siente escalofros o le sube la fiebre.  Le falta el aire.  Siente ardor al Beatrix Shipper.  [place] o sube ms de 2 libras (900 g), o segn lo indicado por el profesional que la asiste.  Observa que sbitamente se le [employer], las  manos, los pies o las piernas.  Siente dolor en el vientre (abdominal). Las [employer] en el ligamento redondo son Neomia Dear causa benigna frecuente de dolor abdominal durante el embarazo. El profesional que la asiste deber evaluarla.  Presenta dolor de cabeza intenso que no se [place].  Tiene problemas visuales, visin doble o  borrosa.  Si no siente los movimientos del beb durante ms de 1 hora. Si piensa que el beb no se mueve tanto como lo haca habitualmente, coma algo que Psychologist, clinical y [employer] lado izquierdo durante [place]. El beb debe moverse al menos 4  5 veces por hora. Comunquese inmediatamente si el beb se mueve menos que lo indicado.  Se cae, se ve involucrada en un accidente automovilstico o sufre algn tipo de traumatismo.  En su hogar hay violencia mental o fsica. Document Released: 10/07/2004 Document Revised: 06/29/2011 [facility] Patient Information 2013 [place], [place].

## 2011-11-03 NOTE — Progress Notes (Signed)
Pulse 114. 2nd BP: 123/84

## 2011-11-08 ENCOUNTER — Ambulatory Visit (INDEPENDENT_AMBULATORY_CARE_PROVIDER_SITE_OTHER): Payer: Self-pay | Admitting: Advanced Practice Midwife

## 2011-11-08 ENCOUNTER — Encounter: Payer: Self-pay | Attending: Advanced Practice Midwife | Admitting: Dietician

## 2011-11-08 VITALS — BP 130/81 | Temp 97.0°F | Wt 180.0 lb

## 2011-11-08 DIAGNOSIS — O24419 Gestational diabetes mellitus in pregnancy, unspecified control: Secondary | ICD-10-CM

## 2011-11-08 DIAGNOSIS — O99810 Abnormal glucose complicating pregnancy: Secondary | ICD-10-CM | POA: Insufficient documentation

## 2011-11-08 DIAGNOSIS — Z3480 Encounter for supervision of other normal pregnancy, unspecified trimester: Secondary | ICD-10-CM

## 2011-11-08 DIAGNOSIS — Z713 Dietary counseling and surveillance: Secondary | ICD-10-CM | POA: Insufficient documentation

## 2011-11-08 LAB — COMPREHENSIVE METABOLIC PANEL
AST: 21 U/L (ref 0–37)
Alkaline Phosphatase: 91 U/L (ref 39–117)
Glucose, Bld: 98 mg/dL (ref 70–99)
Sodium: 137 mEq/L (ref 135–145)
Total Bilirubin: 1 mg/dL (ref 0.3–1.2)
Total Protein: 6.5 g/dL (ref 6.0–8.3)

## 2011-11-08 LAB — CBC
Hemoglobin: 12.8 g/dL (ref 12.0–15.0)
MCH: 30.6 pg (ref 26.0–34.0)
MCV: 90.2 fL (ref 78.0–100.0)
RBC: 4.18 MIL/uL (ref 3.87–5.11)

## 2011-11-08 LAB — POCT URINALYSIS DIP (DEVICE)
Nitrite: NEGATIVE
Protein, ur: NEGATIVE mg/dL
Urobilinogen, UA: 1 mg/dL (ref 0.0–1.0)
pH: 7 (ref 5.0–8.0)

## 2011-11-08 NOTE — Progress Notes (Signed)
Spanish Interpreter: Trinna Post. Pulse 100

## 2011-11-08 NOTE — Progress Notes (Signed)
Seen for first HR visit. Is seeing Diabetes nurse today to get monitory and diet instruction. Already familiar with diet. Feels well.

## 2011-11-08 NOTE — Progress Notes (Signed)
Diabetes Education:  Completed review of the Carb restricted diet for GDM. Has a history of GDM, had a number of questions regarding portion sizes.  Used food models for explaining.  Provided handout "Nutrition, Diabetes and Pregnancy", and "Carbohydrate Counting" by [employer].  Both were in [place].  Was assisted in the education session by Trinna Post  The Spanish interpreter.  Provided a True Track meter Kit, Lot: [id number]  Exp: 2013/10/29 and 1 box of Strips [id number] EXp: 2013/12/10 and 1 box lancets Lot: [id number] Exp: 2015/06/14 On return demonstration, she broke the lancing devisce and it was replaced with lancing device Lot [id number].  Her fasting glucose at this time was 116.  Instructed to bring her meter and glucose log to all clinic appointments.  Maggie Jan Walters, RN, CDE

## 2011-11-08 NOTE — Patient Instructions (Signed)
Gua de planeamiento de la alimentacin para diabticos (Diabetes Meal Planning Guide) La gua de planeamiento de alimentacin para diabticos es una herramienta para ayudarlo a planear sus comidas y colaciones. Es importante para las personas con diabetes controlar sus niveles de Location manager. Elegir los [employer] correctos y las cantidades adecuadas durante el da le ayudar a Media planner. [place] bien puede incluso ayudarlo a mejorar la presin sangunea y Science writer o Theatre manager un peso saludable. CUENTE LOS HIDRATOS DE CARBONO CON FACILIDAD Cuando consume hidratos de carbono, stos se transforman en azcar (glucosa). Esto a su vez Agricultural consultant de Museum/gallery exhibitions officer. El conteo de carbohidratos puede ayudarlo a Chief Technology Officer este nivel para que se sienta mejor. Al planear sus alimentos con el conteo de carbohidratos, podr tener ms flexibilidad en lo que come y Curator con el consumo de alimentos. El conteo de carbohidratos significa simplemente sumar la cantidad total de gramos de carbohidratos a sus comidas o colaciones. Trate de consumir la misma cantidad en cada comida. A continuacin encontrar una lista de 1 porcin o 15 gr. de carbohidratos. A continuacin se enumeran. Pregunte al mdico cuntos gramos de carbohidratos necesita comer en cada comida o colacin. Almidones y granos  1 [place] de pan.   bollo ingls o bollo para hamburguesa o hotdog.   taza de cereal fro (sin azcar).   taza de pasta o arroz cocido.   taza de vegetales que contengan almidn (maz, papas, arvejas, porotos, calabaza).  1 omelette (6 pulgadas).   bollo.  1 waffle o panqueque (del tamao de un CD).   taza de cereales cocidos.  4 a 6 galletas saldas pequeas. *Se recomienda el consumo de granos enteros. Frutas  1 taza de frutos rojos, meln, papaya o anan sin azcar.  1 fruta fresca pequea.   banana o mango.   taza de jugo de frutas (4 onzas sin endulzar).    taza de fruta envasada en jugo natural o agua.  2 cucharadas de frutas secas.  12-15 uvas o cerezas. Leche y yogurt  1 taza de [employer] o al 1%.  [place].  6 onzas de yogurt descremado con edulcorante sin azcar.  6 onzas de yogur descremado de soja.  6 onzas de yogur natural. Vegetales  1 taza de vegetales crudos o  de vegetales cocidos se considera cero carbohidratos o una comida "libre".  Si come 3 o ms porciones en una comida, cuntelas como 1 porcin de carbohidratos. Otros carbohidratos   onzas de chips o pretzels.   taza de helado de crema o yogur helado.   taza de helado de agua.  5 cm de torta no congelada.  1 cucharada de miel, azcar, mermelada, jalea o almbar.  2 galletitas dulces pequeas.  3 cuadrados de crackers de graham.  3 tazas de palomitas de maz.  6 crackers.  1 taza de caldo.  Cuente 1 taza de guisado u otra mezcla de alimentos como 2 porciones de carbohidratos.  Los alimentos con menos de 20 caloras por porcin deben contarse como cero carbohidratos o alimento "libre". Si lo desea compre un libro o software de computacin que enumere la cantidad de gramos de carbohidratos de los diferentes alimentos. Adems, el panel nutricional en las etiquetas de los productos que consume es una buena fuente de informacin. Le indicar el tamao de la porcin y la cantidad total de carbohidratos que consumir por cada una. Divida este nmero por 15 para obtener el nmero  de conteo de carbohidratos por porcin. Recuerde: cada porcin son 75 gramos de carbohidratos. PORCIONES La medicin de los alimentos y el tamao de las porciones lo ayudarn a Scientist, physiological cantidad exacta de comida que debe ingerir. La lista que sigue le mostrar el tamao de algunas porciones comunes.   1 onza.................4 dados apilados.  3 onzas..............Marland KitchenUn mazo de cartas.  1 cucharadita...Marland KitchenMarland KitchenLa punta de un dedo pequeo.  1  cucharada.......Marland KitchenUn dedo.  2 cucharadas....Marland KitchenMarland KitchenUna pelota de golf.   taza..............Marland KitchenLa mitad de un puo.  1 taza...............Marland KitchenUn puo. EJEMPLO DE PLAN DE ALIMENTACIN PARA DIABTICOS: A continuacin se muestra un ejemplo de plan de alimentacin que incluye comidas de los grupos de granos y [place], Sports administrator, frutas y carnes. Un nutricionista podr confeccionarle un plan individualizado para cubrir sus necesidades calricas y decirle el nmero de porciones que necesita de [place]. Sin embargo, podra [employer] alimentos que contengan carbohidratos (lcteos, cereales y frutas). Controlar la cantidad total de carbohidratos en los alimentos o colaciones es ms importante que asegurarse de incluir todos los grupos alimenticios cada vez que come.  El siguiente plan de alimentacin es un ejemplo de una dieta de 2000 caloras mediante el conteo de carbohidratos. Este plan contiene 17 porciones de carbohidratos. Desayuno  1 taza de avena (2 porciones de carbohidratos).   taza de yogur light(1 porcin de carbohidratos).  1 taza de arndanos (1 porcin de carbohidratos).   taza de almendras. Colacin  1 manzana grande (2 porciones de carbohidratos).  1 palito de queso bajo [employer]. Almuerzo  Ensalada de pechuga de pollo.  1 taza de espinacas.   taza de tomates cortados.  2 oz (60 gr) de pechuga de pollo en rebanadas.  2 cucharadas de aderezo italiano bajo en [employer].  12 galletas integrales (2 porciones de carbohidratos).  12 a 15 uvas (1 porcin de carbohidratos).  1 taza de [employer] (1porcin de carbohidratos). Colacin  1 taza de zanahorias.   taza de pur de garbanzos (1 porcin de carbohidratos). Cena  3 oz (80 gr) de salmn a la parrilla.  1 taza de arroz integral (3 porciones de carbohidratos). Colacin  1  taza de brcoli al vapor (1 porcin de carbohidrato) con una cucharadita de aceite de oliva y jugo de limn.  1 taza de  budn light (2 porciones de carbohidratos). HOJA DE PLANEAMIENTO DE LA ALIMENTACIN: El dietista podr utilizar esta hoja para ayudarlo a decidir cuntas porciones y qu tipos de alimentos son los adecuados para usted.  DESAYUNO Grupo de alimentos y porciones / Alimento elegido Granos/Fculas_________________________________________________ Lcteos________________________________________________________ Rufina Falco ______________________________________________________ Lou Miner __________________________________________________________ Charlesetta Ivory _________________________________________________________ Rosalin Hawking _________________________________________________________ Lorin Mercy de alimentos y porciones / Alimento elegido Granos/Fculas___________________________________________________ Lcteos_________________________________________________________ Lou Miner ___________________________________________________________ Charlesetta Ivory __________________________________________________________ Rosalin Hawking __________________________________________________________ Danford Bad de alimentos y porciones / Alimento elegido Granos/Fculas___________________________________________________ Lcteos_________________________________________________________ Lou Miner ___________________________________________________________ Charlesetta Ivory __________________________________________________________ Rosalin Hawking __________________________________________________________ Jettie Pagan de alimentos y porciones / Alimento elegido Granos/Fculas_________________________________________________ Lcteos________________________________________________________ Rufina Falco ______________________________________________________ Lou Miner _________________________________________________________ Charlesetta Ivory ________________________________________________________ Rosalin Hawking ________________________________________________________ Carolyn Stare  DIARIOS Fculas_______________________________________________________ Vegetales _____________________________________________________ Lou Miner ________________________________________________________ Lcteos_______________________________________________________ Carnes________________________________________________________ Rosalin Hawking ________________________________________________________ Document Released: 04/06/2007 Document Revised: 03/22/2011 ExitCare Patient Information 2013 [place] [place]. Diabetes Meal Planning Guide The diabetes meal planning guide is a tool to help you plan your meals and snacks. It is important for people with diabetes to manage their blood glucose (sugar) levels. Choosing the right foods and the right amounts throughout your day will help control your blood glucose. Eating right can even help you improve your blood pressure and reach or maintain a healthy weight. CARBOHYDRATE COUNTING MADE EASY When you eat  carbohydrates, they turn to sugar. This raises your blood glucose level. Counting carbohydrates can help you control this level so you feel better. When you plan your meals by counting carbohydrates, you can have more flexibility in what you eat and balance your medicine with your food intake. Carbohydrate counting simply means adding up the total amount of carbohydrate grams in your meals and snacks. Try to eat about the same amount at each meal. Foods with carbohydrates are listed below. Each portion below is 1 carbohydrate serving or 15 grams of carbohydrates. Ask your dietician how many grams of carbohydrates you should eat at each meal or snack. Grains and Starches  1 slice bread.   English muffin or hotdog/hamburger bun.   cup cold cereal (unsweetened).   cup cooked pasta or rice.   cup starchy vegetables (corn, potatoes, peas, beans, winter squash).  1 tortilla (6 inches).   bagel.  1 waffle or pancake (size of a CD).   cup cooked cereal.  4  to 6 small crackers. *Whole grain is recommended. Fruit  1 cup fresh unsweetened berries, melon, papaya, pineapple.  1 small fresh fruit.   banana or mango.   cup fruit juice (4 oz unsweetened).   cup canned fruit in natural juice or water.  2 tbs dried fruit.  12 to 15 grapes or cherries. Milk and Yogurt  1 cup fat-free or 1% milk.  1 cup soy milk.  6 oz light yogurt with sugar-free sweetener.  6 oz low-fat soy yogurt.  6 oz plain yogurt. Vegetables  1 cup raw or  cup cooked is counted as 0 carbohydrates or a "free" food.  If you eat 3 or more servings at 1 meal, count them as 1 carbohydrate serving. Other Carbohydrates   oz chips or pretzels.   cup ice cream or frozen yogurt.   cup sherbet or sorbet.  2 inch square cake, no frosting.  1 tbs honey, sugar, jam, jelly, or syrup.  2 small cookies.  3 squares of graham crackers.  3 cups popcorn.  6 crackers.  1 cup broth-based soup.  Count 1 cup casserole or other mixed foods as 2 carbohydrate servings.  Foods with less than 20 calories in a serving may be counted as 0 carbohydrates or a "free" food. You may want to purchase a book or computer software that lists the carbohydrate gram counts of different foods. In addition, the nutrition facts panel on the labels of the foods you eat are a good source of this information. The label will tell you how big the serving size is and the total number of carbohydrate grams you will be eating per serving. Divide this number by 15 to obtain the number of carbohydrate servings in a portion. Remember, 1 carbohydrate serving equals 15 grams of carbohydrate. SERVING SIZES Measuring foods and serving sizes helps you make sure you are getting the right amount of food. The list below tells how big or small some common serving sizes are.  1 oz.........4 stacked dice.  3 oz........Marland KitchenDeck of cards.  1 tsp.......Marland KitchenTip of little finger.  1 tbs......Marland KitchenMarland KitchenThumb.  2  tbs.......Marland KitchenGolf ball.   cup......Marland KitchenHalf of a fist.  1 cup.......Marland KitchenA fist. SAMPLE DIABETES MEAL PLAN Below is a sample meal plan that includes foods from the grain and starches, dairy, vegetable, fruit, and meat groups. A dietician can individualize a meal plan to fit your calorie needs and tell you the number of servings needed from each food group. However, controlling the total amount of  carbohydrates in your meal or snack is more important than making sure you include all of the food groups at every meal. You may interchange carbohydrate containing foods (dairy, starches, and fruits). The meal plan below is an example of a 2000 calorie diet using carbohydrate counting. This meal plan has 17 carbohydrate servings. Breakfast  1 cup oatmeal (2 carb servings).   cup light yogurt (1 carb serving).  1 cup blueberries (1 carb serving).   cup almonds. Snack  1 large apple (2 carb servings).  1 low-fat string cheese stick. Lunch  Chicken breast salad.  1 cup spinach.   cup chopped tomatoes.  2 oz chicken breast, sliced.  2 tbs low-fat [place] dressing.  12 whole-wheat crackers (2 carb servings).  12 to 15 grapes (1 carb serving).  1 cup low-fat milk (1 carb serving). Snack  1 cup carrots.   cup hummus (1 carb serving). Dinner  3 oz broiled salmon.  1 cup brown rice (3 carb servings). Snack  1  cups steamed broccoli (1 carb serving) drizzled with 1 tsp olive oil and lemon juice.  1 cup light pudding (2 carb servings). DIABETES MEAL PLANNING WORKSHEET Your dietician can use this worksheet to help you decide how many servings of foods and what types of foods are right for you.  BREAKFAST Food Group and Servings / Carb Servings Grain/Starches __________________________________ Dairy __________________________________________ Vegetable ______________________________________ Fruit ___________________________________________ Meat  __________________________________________ Fat ____________________________________________ LUNCH Food Group and Servings / Carb Servings Grain/Starches ___________________________________ Dairy ___________________________________________ Fruit ____________________________________________ Meat ___________________________________________ Fat _____________________________________________ Laural Golden Food Group and Servings / Carb Servings Grain/Starches ___________________________________ Dairy ___________________________________________ Fruit ____________________________________________ Meat ___________________________________________ Fat _____________________________________________ SNACKS Food Group and Servings / Carb Servings Grain/Starches ___________________________________ Dairy ___________________________________________ Vegetable _______________________________________ Fruit ____________________________________________ Meat ___________________________________________ Fat _____________________________________________ DAILY TOTALS Starches _________________________ Vegetable ________________________ Fruit ____________________________ Dairy ____________________________ Meat ____________________________ Fat ______________________________ Document Released: 09/24/2004 Document Revised: 03/22/2011 Document Reviewed: 08/05/2008 ExitCare Patient Information 2013 [place], [place].

## 2011-11-09 LAB — CREATININE CLEARANCE, URINE, 24 HOUR
Creatinine Clearance: 192 mL/min — ABNORMAL HIGH (ref 75–115)
Creatinine, 24H Ur: 1164 mg/d (ref 700–1800)

## 2011-11-09 LAB — PROTEIN, URINE, 24 HOUR: Protein, Urine: 5 mg/dL

## 2011-11-15 ENCOUNTER — Ambulatory Visit (INDEPENDENT_AMBULATORY_CARE_PROVIDER_SITE_OTHER): Payer: Self-pay | Admitting: Family Medicine

## 2011-11-15 VITALS — BP 125/82 | Temp 97.3°F | Wt 178.2 lb

## 2011-11-15 DIAGNOSIS — O24419 Gestational diabetes mellitus in pregnancy, unspecified control: Secondary | ICD-10-CM

## 2011-11-15 DIAGNOSIS — O99810 Abnormal glucose complicating pregnancy: Secondary | ICD-10-CM

## 2011-11-15 LAB — POCT URINALYSIS DIP (DEVICE)
Nitrite: NEGATIVE
Protein, ur: NEGATIVE mg/dL
pH: 6 (ref 5.0–8.0)

## 2011-11-15 MED ORDER — GLYBURIDE 2.5 MG PO TABS: 2.5000 mg | ORAL_TABLET | Freq: Every day | ORAL | Status: DC

## 2011-11-15 NOTE — Progress Notes (Signed)
Pulse: 85

## 2011-11-15 NOTE — Progress Notes (Signed)
Fasting 105-132.  PP 81-139, 2-3 over 120.  Will start glyburide 2.5 qhs. Wt loss 2#. Discussed snacks - see nutritionist today. Start antenatal testing. BP normal today. 24 hour urine protein on 10/28 was 93, CMP and platelets normal. No contractions, bleeding, loss of fluid. Baby moving. F/U one week.

## 2011-11-15 NOTE — Patient Instructions (Addendum)
Jasmine Vasquez 2.5 mg (1 tableta) cada noche antes de dormir. Si el azucar en ayunas esta muy bajo, Jasmine 1/2 tableta (1.25 mg) en la noche.  Diabetes mellitus gestacional (Gestational Diabetes Mellitus) La diabetes mellitus gestacional se produce slo durante el embarazo. Aparece cuando el organismo no puede controlar adecuadamente la glucosa (azcar) que aumenta en la sangre despus de comer. Durante el [place], se produce una resistencia a la insulina (sensibilidad reducida a la insulina) debido a la liberacin de hormonas por parte de la placenta. Generalmente, el pncreas de una mujer embarazada produce la cantidad suficiente de insulina para vencer esa resistencia. Sin embargo, en la diabetes gestacional, hay insulina pero no cumple su funcin adecuadamente. Si la resistencia es lo suficientemente grave como para que el pncreas no produzca la cantidad de insulina suficiente, la glucosa extra se acumula en la sangre.  Jasmine Vasquez RIESGO DE DESARROLLAR DIABETES GESTACIONAL?  Las mujeres con historia de diabetes en la familia.  Las mujeres de ms de [address]  Las que presentan sobrepeso.  Las [employer] pertenecen a ciertos grupos tnicos (latinas, afroamericanas, norteamericanas nativas, asiticas y las originarias de las islas del Pacfico. QUE PUEDE OCURRIRLE AL BEB? Si el nivel de glucosa en sangre de la madre es demasiado elevado mientras este [place], el nivel extra de azcar pasar por el cordn umbilical hacia el beb. Algunos de los problemas del beb pueden ser:  Beb demasiado grande: si el nio recibe Chief Strategy Officer, puede aumentar mucho de [place]. Esto puede hacer que sea demasiado grande para nacer por parto normal (vaginal) por lo que ser necesario realizar una cesrea.  Bajo nivel de glucosa (hipoglucemia): el beb produce insulina extra en respuesta a la excesiva cantidad de azcar que obtiene de [employer]. Cuando el beb nace y ya no necesita insulina extra, su  nivel de azcar en sangre puede disminuir.  Ictericia (coloracin amarillenta de la piel y los ojos): esto es bastante frecuente en los bebs. La causa es la acumulacin de una sustancia qumica denominada bilirrubina. No siempre es un trastorno grave, pero se observa con frecuencia en los bebs cuyas madres sufren diabetes gestacional. RIESGOS PARA LA MADRE Las mujeres que han sufrido diabetes gestacional pueden tener ms riesgos para algunos problemas como:  Preeclampsia o toxemia, incluyendo problemas con hipertensin arterial. La presin arterial y los niveles de protenas en la orina deben controlarse con frecuencia.  Infecciones  Parto por cesrea.  Aparicin de diabetes tipo 2 en una etapa posterior de la vida. Alrededor del 30% al 50% sufrir diabetes posteriormente, especialmente las que son obesas. DIAGNSTICO Las hormonas que causan resistencia a la insulina tienen su mayor nivel alrededor de las 24 a 28 semanas del Psychiatrist. Si se experimentan sntomas, stos son similares a los sntomas que normalmente aparecen durante el embarazo.  La diabetes mellitus gestacional generalmente se diagnostica por medio de un mtodo en dos partes: 1. Despus de la 24 a 28 semanas de Psychiatrist, la mujer debe beber una solucin que contiene glucosa y Education officer, environmental un anlisis de [place]. Si el nivel de glucosa es elevado, la realizarn un segundo [place]. 2. La prueba oral de tolerancia a la glucosa, que dura aproximadamente tres horas. Despus de realizar ayuno durante la noche, se controla nivel de glucosa en sangre. La mujer bebe una solucin que contiene glucosa y Chief Executive Officer realizan anlisis de glucosa en sangre cada hora. Si la mujer tiene factores de riesgos para la diabetes mellitus gestacional, el mdico podr indicar el anlisis antes de  las 24 semanas de [place]. TRATAMIENTO El tratamiento est dirigido a Insurance underwriter en sangre de la madre en un nivel normal y puede incluir:  La planificacin de  los alimentos.  Recibir insulina u otro medicamento para Sales executive nivel de glucosa en [place].  La prctica de ejercicios.  Llevar un registro diario de los alimentos que consume.  Control y Engineer, maintenance (IT) de los niveles de glucosa en [place].  Control de los niveles de cetona en la [place], [place] esto ya no se considera necesario en la mayora de los [place]. INSTRUCCIONES PARA EL CUIDADO DOMICILIARIO Mientras est embarazada:  Siga los consejos de su mdico relacionados con los controles prenatales, la planificacin de la comida, la actividad fsica, los [place], vitaminas, los anlisis de sangre y otras pruebas y las actividades fsicas.  Lleve un registro de las comidas, las pruebas de glucosa en sangre y la cantidad de insulina que recibe (si corresponde). Muestre todo al profesional en cada consulta mdica prenatal.  Si sufre diabetes mellitus gestacional, podr tener problemas de hipoglucemia (nivel bajo de glucosa en sangre). Podr sospechar este problema si se siente repentinamente mareada, tiene temblores y/o se siente dbil. Si cree que esto le est ocurriendo, y tiene un medidor de glucosa, mida su nivel de Event organiser. Siga los consejos de su mdico sobre el modo y el momento de tratar su nivel de glucosa en sangre. Generalmente se sigue la regla 15:15 Consuma 15 g de hidratos de carbono, espere 15 minutos y Programmer, systems el nivel de glucosa en [place].Barbara Cower de 15 g de hidratos de carbono son:  1 taza de [employer].   taza de jugo.  3-4 tabletas de glucosa.  5-6 caramelos duros.  1 caja pequea de pasas de uva.   taza de gaseosa comn.  Mantenga una buena higiene para evitar infecciones.  No fume. SOLICITE ATENCIN MDICA SI:  Observa prdida vaginal con o sin picazn.  Se siente ms dbil o cansada que lo habitual.  Primus [place].  Tiene un aumento de peso repentino, 2,5 kg o ms en una semana.  Pierde peso, 1.5 kg o ms en una  semana.  Su nivel de glucosa en sangre es elevado, necesita instrucciones. SOLICITE ATENCIN MDICA DE INMEDIATO SI:  Sufre una cefalea intensa.  Se marea o pierde el conocimiento  Presenta nuseas o vmitos.  Se siente desorientada confundida.  Sufre convulsiones.  Tiene problemas de visin.  Siente Physiological scientist.  Presenta una hemorragia vaginal abundante.  Tiene contracciones uterinas.  Tiene una prdida importante de lquido por la vagina DESPUS QUE NACE EL BEB:  Concurra a todos los controles de seguimiento y Clinical biochemist los anlisis de sangre segn las indicaciones de su mdico.  Mantenga un estilo de vida saludable para evitar la diabetes en el futuro. Aqu se incluye:  Siga el plan de alimentacin saludable.  Controle su peso.  Practique actividad fsica y descanse lo necesario.  No fume.  Amamante a su beb mientras pueda. Esto disminuir la probabilidad de que usted y su beb sufran diabetes posteriormente. Para ms informacin acerca de la diabetes, visite la pgina web de Holiday representative Diabetes Association: [email or web address].com.cy. Para ms informacin acerca de la diabetes gestacional cite la pgina web del [employer] of Obstetricians and Gynecologists en: RentRule.com.au. Document Released: 10/07/2004 Document Revised: 03/22/2011 [facility] Patient Information 2013 [place], [place].   Embarazo  Systems analyst trimestre  (Pregnancy - Third Trimester) El tercer trimestre del embarazo (los ltimos 3 meses) es el perodo  en el cual tanto usted como su beb crecen con ms rapidez. El beb alcanza un largo de aproximadamente 50 cm. y pesa entre 2,700 y 4,500 kg. El beb gana ms tejido graso y est listo para la vida fuera del cuerpo de la [place]. Mientras estn en el interior, los bebs tienen perodos de sueo y vigilia, Warehouse manager y tienen hipo. Quizs sienta pequeas contracciones del tero. Este es el falso trabajo de [place]. Tambin se las  conoce como contracciones de [place] . Es como una prctica del parto. Los problemas ms habituales de esta etapa del embarazo incluyen mayor dificultad para respirar, hinchazn de las manos y los pies por retencin de lquidos y la necesidad de Geographical information systems officer con ms frecuencia debido a que el tero y el beb presionan sobre la vejiga.  EXAMENES PRENATALES   Durante los [employer]power [employer], deber seguir realizndose anlisis de [place]. Estas pruebas se realizan para controlar su salud y la del beb. Los [employer] de sangre se Radiographer, therapeutic para [employer] niveles de algunos compuestos de la sangre (hemoglobina). La anemia (bajo nivel de hemoglobina) es frecuente durante el embarazo. Para prevenirla, se administran hierro y vitaminas. Tambin le tomarn nuevas anlisis para descartar diabetes. Podrn repetirle algunas de las [employer] Enterprises hicieron previamente.  En cada visita le medirn el tamao del tero. Esto permite asegurar que el beb se desarrolla adecuadamente, segn la fecha del embarazo.  Le controlarn la presin arterial en cada visita prenatal. Esto es para asegurarse de que no sufre toxemia.  Le harn un anlisis de orina en cada visita prenatal, para descartar infecciones, diabetes y la presencia de protenas.  Tambin en cada visita controlarn su peso. Esto se realiza para asegurarse que aumenta de peso al ritmo indicado y que usted y su beb evolucionan normalmente.  En algunas ocasiones se realiza una prueba de ultrasonido para confirmar el correcto desarrollo y evolucin del beb. Esta prueba se realiza con ondas sonoras inofensivas para el beb, de modo que el profesional pueda calcular ms precisamente la fecha del [place].  Analice con su mdico los analgsicos y la anestesia que recibir durante el [place] de parto y [place].  Comente la posibilidad de que necesite una cesrea y qu anestesia se recibir.  Informe a su mdico si sufre violencia familiar mental o fsica. A veces,  se indica la prueba especializada sin estrs, la prueba de tolerancia a las contracciones y el perfil biofsico para asegurarse de que el beb no tiene problemas. El estudio del lquido amnitico que rodea al beb se llama amniocentesis. El lquido amnitico se obtiene introduciendo una aguja en el vientre (abdomen ). En ocasiones se lleva a cabo cerca del final del embarazo, si es necesario inducir a un parto. En este caso se realiza para asegurarse que los pulmones del beb estn lo suficientemente maduros como para que pueda vivir fuera del tero. Si los pulmones no han madurado y es peligroso que el beb nazca, se Building services engineer a la madre una inyeccin de [place] , 1 a 2 [address]  [address] antes del [address]. Vivia Budge ayuda a que los pulmones del beb maduren y sea ms seguro su nacimiento.  CAMBIOS QUE OCURREN EN EL TERCER TRIMESTRE DEL EMBARAZO  Su organismo atravesar numerosos cambios durante el [place]. Estos pueden variar de Neomia Dear persona a otra. Converse con el profesional que la asiste acerca los cambios que usted note y que la preocupen.   Durante el ltimo trimestre probablemente sienta un aumento del apetito. Es normal  tener "antojos" de ciertas comidas. Esto vara de Neomia Dear persona a otra y de un embarazo a Therapist, art.  Podrn aparecer las primeras estras en las caderas, abdomen y [place]. Estos son cambios normales del cuerpo durante el [place]. No existen medicamentos ni ejercicios que puedan prevenir [employer].  La constipacin puede tratarse con un laxante o agregando fibra a su dieta. Beber grandes cantidades de lquidos, tomar fibras en forma de vegetales, frutas y granos integrales es de gran [place].  Tambin es beneficioso practicar actividad fsica. Si ha sido una persona Engineer, mining, podr continuar con la [employer]son de las actividades durante el mismo. Si ha sido [employer], puede ser beneficioso que comience con un programa de ejercicios, Museum/gallery exhibitions officer. Consulte con el profesional  que la asiste antes de comenzar un programa de ejercicios.  Evite el consumo de cigarrillos, el alcohol, los medicamentos no recetados y las "drogas de la calle" durante el Psychiatrist. Estas sustancias qumicas afectan la formacin y el desarrollo del beb. Evite estas sustancias durante todo el embarazo para asegurar el nacimiento de un beb sano.  Podr sentir dolor de espalda, tener vrices en las venas y hemorroides, o si ya los sufra, pueden [place].  Durante el tercer trimestre se cansar con ms facilidad, lo cual es normal.  Los movimientos del beb pueden ser ms fuertes y con ms frecuencia.  Puede que note dificultades para respirar normalmente.  El ombligo puede salir hacia afuera.  A veces sale Veterinary surgeon de las [place], que se llama Product manager.  Podr aparecer Neomia Dear secrecin mucosa con sangre. Esto suele ocurrir General Electric unos [address] y Neomia Dear semana antes del [place]. INSTRUCCIONES PARA EL CUIDADO EN EL HOGAR   Cumpla con las citas de control. Siga las indicaciones del mdico con respecto al uso de [place], los ejercicios y la dieta.  Durante el embarazo debe obtener nutrientes para usted y para su beb. Consuma alimentos balanceados a intervalos regulares. Elija alimentos como carne, pescado, [place] y otros productos lcteos descremados, vegetales, frutas, panes integrales y cereales. El [employer] informar cul es el aumento de peso ideal.  Las relaciones sexuales pueden continuarse hasta casi el final del embarazo, si no se presentan otros problemas como prdida prematura (antes de [place]) de lquido amnitico, hemorragia vaginal o dolor en el vientre (abdominal).  Realice Tes[employer] Corporation, si no tiene restricciones. Consulte con el profesional que la asiste si no sabe con certeza si determinados ejercicios son seguros. El mayor aumento de peso se producir en los ltimos 2 trimestres del Psychiatrist. El ejercicio ayuda a:  Pension scheme manager.  Mantenerse en forma para el trabajo de parto y [place] .  Perder peso despus del parto.  Haga reposo con frecuencia, con las piernas elevadas, o segn lo necesite para evitar los calambres y el dolor de cintura.  Use un buen sostn o como los que se usan para hacer deportes para Paramedic la sensibilidad de las [place]. Tambin puede serle til si lo [place] mientras duerme. Si pierde Product manager, podr [employer]in.  No utilice la baera con agua caliente, baos turcos y saunas.  Colquese el cinturn de seguridad cuando conduzca. Este la proteger a usted y al beb en caso de accidente.  Evite comer carne cruda y el contacto con los utensilios y desperdicios de los gatos. Estos elementos contienen grmenes que pueden causar defectos de nacimiento en el beb.  Es fcil perder algo de orina durante el  embarazo. Apretar y Chief Operating Officer los msculos de la pelvis la ayudar con este problema. Practique detener la miccin cuando est en el bao. Estos son los mismos msculos que Development worker, international aid. Son [employer] mismos msculos que utiliza cuando trata de evitar despedir gases. Puede practicar apretando estos msculos Well[employer], y repetir esto tres veces por da aproximadamente. Una vez que conozca qu msculos debe apretar, no realice estos ejercicios durante la miccin. Puede favorecerle una infeccin si la orina vuelve hacia atrs.  Pida ayuda si tienen necesidades financieras, teraputicas o nutricionales. El profesional podr ayudarla con respecto a estas necesidades, o derivarla a otros especialistas.  Haga una lista de nmeros telefnicos de emergencia y tngalos disponibles.  Planifique como obtener ayuda de familiares o amigos cuando regrese a Programmer, applications hospital.  Hacer un ensayo sobre la partida al hospital.  Mesquite clases prenatales con el padre para entender, practicar y hacer preguntas sobre el [place] de parto y el alumbramiento.  Preparar la habitacin del  beb / busque Fatima Blank.  No viaje fuera de la ciudad a menos que sea absolutamente necesario y con el asesoramiento de su mdico.  Use slo zapatos de tacn bajo o sin tacn para tener mejor equilibrio y Automotive engineer cadas. USO DE MEDICAMENTOS Y CONSUMO DE DROGAS DURANTE EL [facility]   Jasmine las vitaminas apropiadas para esta etapa tal como se le indic. Las vitaminas deben contener un miligramo de cido flico. Guarde todas las vitaminas fuera del alcance de los nios. La ingestin de slo un par de vitaminas o tabletas que contengan hierro pueden ocasionar la [employer] en un beb o en un nio pequeo.  Evite el uso de [employer] Mutual [employer] Omaha, incluyendo hierbas, medicamentos de [place], sin receta o que no hayan sido sugeridos por su mdico. Slo Jasmine medicamentos de venta libre o medicamentos recetados para Chief Technology Officer, Environmental health practitioner o fiebre como lo indique su mdico. No Jasmine aspirina, ibuprofeno (Motrin, Advil, Nuprin) o naproxeno (Aleve) excepto que su mdico se lo indique.  Infrmele al profesional si consume alguna droga.  El alcohol se relaciona con ciertos defectos congnitos. Incluye el sndrome de alcoholismo fetal. Debe evitar absolutamente el consumo de alcohol, en cualquier forma. El fumar produce baja tasa de natalidad y bebs prematuros.  Las drogas ilegales o de la calle son muy perjudiciales para el beb. Estn absolutamente prohibidas. Un beb que nace de [employer] Express, ser adicto al nacer. Ese beb tendr los mismos sntomas de abstinencia que un adulto. SOLICITE ATENCIN MDICA SI:  Tiene preguntas o preocupaciones relacionadas con el embarazo. Es mejor que llame para formular las preguntas si no puede esperar hasta la prxima visita, que sentirse preocupada por ellas.  DECISIONES ACERCA DE LA CIRCUNCISIN  Usted puede saber o no cul es el sexo de su beb. Si ya sabe que ser un varn, este es el momento de pensar acerca de la circuncisin. La circuncisin es la extirpacin  del prepucio. Esta es la piel que cubre el extremo sensible del pene. No hay un motivo mdico que lo justifique. Generalmente la decisin se toma segn lo que sea popular en ese momento, o segn creencias religiosas. Podr conversar estos temas con su mdico o con el pediatra.  SOLICITE ATENCIN MDICA DE INMEDIATO SI:   La temperatura oral le sube a ms de 102 F (38.9 C) o lo que su mdico le indique.  Tiene una prdida de lquido por la vagina (canal de parto). Si sospecha una ruptura de las [place],  tmese la temperatura y llame al profesional para informarlo sobre esto.  Observa unas pequeas manchas, una hemorragia vaginal o elimina cogulos. Notifique al profesional acerca de la cantidad y de cuntos apsitos est utilizando.  Presenta un olor desagradable en la secrecin vaginal y observa un cambio en el color, de transparente a blanco.  Ha vomitado durante ms de 24 horas.  Siente escalofros o le sube la fiebre.  Le falta el aire.  Siente ardor al Beatrix Shipper.  Baja o sube ms de 2 libras (900 g), o segn lo indicado por el profesional que la asiste.  Observa que sbitamente se le hinchan el rostro, las manos, los pies o las piernas.  Siente dolor en el vientre (abdominal). Las [employer] en el ligamento redondo son Neomia Dear causa benigna frecuente de dolor abdominal durante el embarazo. El profesional que la asiste deber evaluarla.  Presenta dolor de cabeza intenso que no se [place].  Tiene problemas visuales, visin doble o borrosa.  Si no siente los movimientos del beb durante ms de 1 hora. Si piensa que el beb no se mueve tanto como lo haca habitualmente, coma algo que Psychologist, clinical y Target Corporation lado izquierdo durante [place]. El beb debe moverse al menos 4  5 veces por hora. Comunquese inmediatamente si el beb se mueve menos que lo indicado.  Se cae, se ve involucrada en un accidente automovilstico o sufre algn tipo de traumatismo.  En su hogar hay  violencia mental o fsica. Document Released: 10/07/2004 Document Revised: 06/29/2011 [facility] Patient Information 2013 [place], [place].

## 2011-11-15 NOTE — Progress Notes (Signed)
Nutrition note: consult Pt referred for wt loss. 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 Pt lost 1.8# since last appt 11/08/11. Pt reports eating 3 meals & 1 snack of milk/d. Pt's fasting BS: 105-142; 2hr pp: 79-139. Disc importance of including a protein source with all meals & snacks and encouraged pt to eat 1-2 more snacks/d. Provided handout in Spanish with snacks that are high in calories & other tips to help with wt gain. Pt agrees to add more protein rich snacks throughout day. Pt does not have WIC & is not interested in applying. F/u if referred Blondell Reveal, MS, RD, LDN

## 2011-11-18 ENCOUNTER — Ambulatory Visit (INDEPENDENT_AMBULATORY_CARE_PROVIDER_SITE_OTHER): Payer: Self-pay | Admitting: *Deleted

## 2011-11-18 VITALS — BP 129/68 | Temp 97.0°F | Wt 178.0 lb

## 2011-11-18 DIAGNOSIS — O99810 Abnormal glucose complicating pregnancy: Secondary | ICD-10-CM

## 2011-11-18 NOTE — Progress Notes (Signed)
P=81, Used Interpreter [employer]

## 2011-11-18 NOTE — Progress Notes (Signed)
NST reviewed and reactive.  Derra Shartzer L. Harraway-Smith, M.D., FACOG    

## 2011-11-22 ENCOUNTER — Ambulatory Visit (INDEPENDENT_AMBULATORY_CARE_PROVIDER_SITE_OTHER): Payer: Self-pay | Admitting: Obstetrics & Gynecology

## 2011-11-22 VITALS — BP 136/87 | Temp 97.1°F | Wt 176.0 lb

## 2011-11-22 DIAGNOSIS — O99810 Abnormal glucose complicating pregnancy: Secondary | ICD-10-CM

## 2011-11-22 LAB — POCT URINALYSIS DIP (DEVICE)
Leukocytes, UA: NEGATIVE
Protein, ur: 30 mg/dL — AB
Specific Gravity, Urine: >=1.03 (ref 1.005–1.030)
Urobilinogen, UA: 0.2 mg/dL (ref 0.0–1.0)

## 2011-11-22 NOTE — Progress Notes (Signed)
FBS now <84, [id number]. Continue glyburide.

## 2011-11-22 NOTE — Progress Notes (Signed)
Pulse = 112 

## 2011-11-22 NOTE — Patient Instructions (Signed)
Diabetes mellitus gestacional (Gestational Diabetes Mellitus) La diabetes mellitus gestacional se produce slo durante el embarazo. Aparece cuando el organismo no puede controlar adecuadamente la glucosa (azcar) que aumenta en la sangre despus de comer. Durante el embarazo, se produce una resistencia a la insulina (sensibilidad reducida a la insulina) debido a la liberacin de hormonas por parte de la placenta. Generalmente, el pncreas de una mujer embarazada produce la cantidad suficiente de insulina para vencer esa resistencia. Sin embargo, en la diabetes gestacional, hay insulina pero no cumple su funcin adecuadamente. Si la resistencia es lo suficientemente grave como para que el pncreas no produzca la cantidad de insulina suficiente, la glucosa extra se acumula en la sangre.  QUINES TIENEN RIESGO DE DESARROLLAR DIABETES GESTACIONAL?  Las mujeres con historia de diabetes en la familia.  Las mujeres de ms de 25 aos.  Las que presentan sobrepeso.  Las mujeres que pertenecen a ciertos grupos tnicos (latinas, afroamericanas, norteamericanas nativas, asiticas y las originarias de las islas del [employer]. QUE PUEDE OCURRIRLE AL BEB? Si el nivel de glucosa en sangre de la madre es demasiado elevado mientras este embarazada, el nivel extra de azcar pasar por el cordn umbilical hacia el beb. Algunos de los problemas del beb pueden ser:  Beb demasiado grande: si el nio recibe demasiada azcar, puede aumentar mucho de peso. Esto puede hacer que sea demasiado grande para nacer por parto normal (vaginal) por lo que ser necesario realizar una cesrea.  Bajo nivel de glucosa (hipoglucemia): el beb produce insulina extra en respuesta a la excesiva cantidad de azcar que obtiene de la madre. Cuando el beb nace y ya no necesita insulina extra, su nivel de azcar en sangre puede disminuir.  Ictericia (coloracin amarillenta de la piel y los ojos): esto es bastante frecuente en los bebs. La  causa es la acumulacin de una sustancia qumica denominada bilirrubina. No siempre es un trastorno grave, pero se observa con frecuencia en los bebs cuyas madres sufren diabetes gestacional. RIESGOS PARA LA MADRE Las mujeres que han sufrido diabetes gestacional pueden tener ms riesgos para algunos problemas como:  Preeclampsia o toxemia, incluyendo problemas con hipertensin arterial. La presin arterial y los niveles de protenas en la orina deben controlarse con frecuencia.  Infecciones  Parto por cesrea.  Aparicin de diabetes tipo 2 en una etapa posterior de la vida. Alrededor del 30% al 50% sufrir diabetes posteriormente, especialmente las que son obesas. DIAGNSTICO Las hormonas que causan resistencia a la insulina tienen su mayor nivel alrededor de las 24 a 28 semanas del embarazo. Si se experimentan sntomas, stos son similares a los sntomas que normalmente aparecen durante el embarazo.  La diabetes mellitus gestacional generalmente se diagnostica por medio de un mtodo en dos partes: 1. Despus de la 24 a 28 semanas de embarazo, la mujer debe beber una solucin que contiene glucosa y realizar un anlisis de sangre. Si el nivel de glucosa es elevado, la realizarn un segundo anlisis. 2. La prueba oral de tolerancia a la glucosa, que dura aproximadamente tres horas. Despus de realizar ayuno durante la noche, se controla nivel de glucosa en sangre. La mujer bebe una solucin que contiene glucosa y le realizan anlisis de glucosa en sangre cada hora. Si la mujer tiene factores de riesgos para la diabetes mellitus gestacional, el mdico podr indicar el anlisis antes de las 24 semanas de embarazo. TRATAMIENTO El tratamiento est dirigido a mantener la glucosa en sangre de la madre en un nivel normal y puede incluir:  La   planificacin de los alimentos.  Recibir insulina u otro medicamento para controlar el nivel de glucosa en sangre.  La prctica de ejercicios.  Llevar un  registro diario de los alimentos que consume.  Control y registro de los niveles de glucosa en sangre.  Control de los niveles de cetona en la orina, aunque esto ya no se considera necesario en la mayora de los embarazos. INSTRUCCIONES PARA EL CUIDADO DOMICILIARIO Mientras est embarazada:  Siga los consejos de su mdico relacionados con los controles prenatales, la planificacin de la comida, la actividad fsica, los medicamentos, vitaminas, los anlisis de sangre y otras pruebas y las actividades fsicas.  Lleve un registro de las comidas, las pruebas de glucosa en sangre y la cantidad de insulina que recibe (si corresponde). Muestre todo al profesional en cada consulta mdica prenatal.  Si sufre diabetes mellitus gestacional, podr tener problemas de hipoglucemia (nivel bajo de glucosa en sangre). Podr sospechar este problema si se siente repentinamente mareada, tiene temblores y/o se siente dbil. Si cree que esto le est ocurriendo, y tiene un medidor de glucosa, mida su nivel de glucosa en sangre. Siga los consejos de su mdico sobre el modo y el momento de tratar su nivel de glucosa en sangre. Generalmente se sigue la regla 15:15 Consuma 15 g de hidratos de carbono, espere 15 minutos y vuelva controlar el nivel de glucosa en sangre.. Ejemplos de 15 g de hidratos de carbono son:  1 taza de leche descremada.   taza de jugo.  3-4 tabletas de glucosa.  5-6 caramelos duros.  1 caja pequea de pasas de uva.   taza de gaseosa comn.  Mantenga una buena higiene para evitar infecciones.  No fume. SOLICITE ATENCIN MDICA SI:  Observa prdida vaginal con o sin picazn.  Se siente ms dbil o cansada que lo habitual.  Transpira mucho.  Tiene un aumento de peso repentino, 2,5 kg o ms en una semana.  Pierde peso, 1.5 kg o ms en una semana.  Su nivel de glucosa en sangre es elevado, necesita instrucciones. SOLICITE ATENCIN MDICA DE INMEDIATO SI:  Sufre una cefalea  intensa.  Se marea o pierde el conocimiento  Presenta nuseas o vmitos.  Se siente desorientada confundida.  Sufre convulsiones.  Tiene problemas de visin.  Siente dolor en el estmago.  Presenta una hemorragia vaginal abundante.  Tiene contracciones uterinas.  Tiene una prdida importante de lquido por la vagina DESPUS QUE NACE EL BEB:  Concurra a todos los controles de seguimiento y realice los anlisis de sangre segn las indicaciones de su mdico.  Mantenga un estilo de vida saludable para evitar la diabetes en el futuro. Aqu se incluye:  Siga el plan de alimentacin saludable.  Controle su peso.  Practique actividad fsica y descanse lo necesario.  No fume.  Amamante a su beb mientras pueda. Esto disminuir la probabilidad de que usted y su beb sufran diabetes posteriormente. Para ms informacin acerca de la diabetes, visite la pgina web de la American Diabetes Association: www.americandiabetesassociation.org. Para ms informacin acerca de la diabetes gestacional cite la pgina web del American Congress of Obstetricians and [employer]necologists en: www.acog.org. Document Released: 10/07/2004 Document Revised: 03/22/2011 [employer] Patient Information 2013 [employer].  

## 2011-11-23 ENCOUNTER — Ambulatory Visit (HOSPITAL_COMMUNITY)
Admission: RE | Admit: 2011-11-23 | Discharge: 2011-11-23 | Disposition: A | Discharge status: Home / Self Care | Payer: Self-pay | Source: Ambulatory Visit | Attending: Obstetrics & Gynecology | Admitting: Obstetrics & Gynecology

## 2011-11-23 DIAGNOSIS — O99810 Abnormal glucose complicating pregnancy: Secondary | ICD-10-CM

## 2011-11-23 DIAGNOSIS — O0930 Supervision of pregnancy with insufficient antenatal care, unspecified trimester: Secondary | ICD-10-CM | POA: Insufficient documentation

## 2011-11-23 DIAGNOSIS — O09529 Supervision of elderly multigravida, unspecified trimester: Secondary | ICD-10-CM | POA: Insufficient documentation

## 2011-11-23 DIAGNOSIS — O09299 Supervision of pregnancy with other poor reproductive or obstetric history, unspecified trimester: Secondary | ICD-10-CM | POA: Insufficient documentation

## 2011-11-25 ENCOUNTER — Ambulatory Visit (INDEPENDENT_AMBULATORY_CARE_PROVIDER_SITE_OTHER): Payer: Self-pay | Admitting: *Deleted

## 2011-11-25 VITALS — BP 117/75 | Wt 177.5 lb

## 2011-11-25 DIAGNOSIS — O24419 Gestational diabetes mellitus in pregnancy, unspecified control: Secondary | ICD-10-CM

## 2011-11-25 DIAGNOSIS — O99810 Abnormal glucose complicating pregnancy: Secondary | ICD-10-CM

## 2011-11-25 NOTE — Progress Notes (Signed)
NST reviewed and reactive.  

## 2011-11-25 NOTE — Progress Notes (Signed)
P = 91 

## 2011-11-29 ENCOUNTER — Ambulatory Visit (INDEPENDENT_AMBULATORY_CARE_PROVIDER_SITE_OTHER): Payer: Self-pay | Admitting: Family Medicine

## 2011-11-29 ENCOUNTER — Encounter: Payer: Self-pay | Admitting: Family Medicine

## 2011-11-29 VITALS — BP 134/74 | Temp 97.1°F | Wt 179.0 lb

## 2011-11-29 DIAGNOSIS — O99810 Abnormal glucose complicating pregnancy: Secondary | ICD-10-CM

## 2011-11-29 DIAGNOSIS — R8281 Pyuria: Secondary | ICD-10-CM

## 2011-11-29 DIAGNOSIS — O0990 Supervision of high risk pregnancy, unspecified, unspecified trimester: Secondary | ICD-10-CM

## 2011-11-29 DIAGNOSIS — R82998 Other abnormal findings in urine: Secondary | ICD-10-CM

## 2011-11-29 LAB — POCT URINALYSIS DIP (DEVICE)
Protein, ur: 30 mg/dL — AB
Specific Gravity, Urine: >=1.03 (ref 1.005–1.030)
Urobilinogen, UA: 0.2 mg/dL (ref 0.0–1.0)
pH: 5.5 (ref 5.0–8.0)

## 2011-11-29 NOTE — Progress Notes (Signed)
FBS 77-100 2 out of range. 2 hr 78-126. One out of range  Will check urine culture secondary to hgb and pro and leuk on u/a

## 2011-11-29 NOTE — Patient Instructions (Signed)
Diabetes mellitus gestacional (Gestational Diabetes Mellitus) La diabetes mellitus gestacional se produce slo durante el embarazo. Aparece cuando el organismo no puede controlar adecuadamente la glucosa (azcar) que aumenta en la sangre despus de comer. Durante el embarazo, se produce una resistencia a la insulina (sensibilidad reducida a la insulina) debido a la liberacin de hormonas por parte de la placenta. Generalmente, el pncreas de una mujer embarazada produce la cantidad suficiente de insulina para vencer esa resistencia. Sin embargo, en la diabetes gestacional, hay insulina pero no cumple su funcin adecuadamente. Si la resistencia es lo suficientemente grave como para que el pncreas no produzca la cantidad de insulina suficiente, la glucosa extra se acumula en la sangre.  QUINES TIENEN RIESGO DE DESARROLLAR DIABETES GESTACIONAL?  Las mujeres con historia de diabetes en la familia.  Las mujeres de ms de 25 aos.  Las que presentan sobrepeso.  Las mujeres que pertenecen a ciertos grupos tnicos (latinas, afroamericanas, norteamericanas nativas, asiticas y las originarias de las islas del [employer]. QUE PUEDE OCURRIRLE AL BEB? Si el nivel de glucosa en sangre de la madre es demasiado elevado mientras este embarazada, el nivel extra de azcar pasar por el cordn umbilical hacia el beb. Algunos de los problemas del beb pueden ser:  Beb demasiado grande: si el nio recibe demasiada azcar, puede aumentar mucho de peso. Esto puede hacer que sea demasiado grande para nacer por parto normal (vaginal) por lo que ser necesario realizar una cesrea.  Bajo nivel de glucosa (hipoglucemia): el beb produce insulina extra en respuesta a la excesiva cantidad de azcar que obtiene de la madre. Cuando el beb nace y ya no necesita insulina extra, su nivel de azcar en sangre puede disminuir.  Ictericia (coloracin amarillenta de la piel y los ojos): esto es bastante frecuente en los bebs.  La causa es la acumulacin de una sustancia qumica denominada bilirrubina. No siempre es un trastorno grave, pero se observa con frecuencia en los bebs cuyas madres sufren diabetes gestacional. RIESGOS PARA LA MADRE Las mujeres que han sufrido diabetes gestacional pueden tener ms riesgos para algunos problemas como:  Preeclampsia o toxemia, incluyendo problemas con hipertensin arterial. La presin arterial y los niveles de protenas en la orina deben controlarse con frecuencia.  Infecciones  Parto por cesrea.  Aparicin de diabetes tipo 2 en una etapa posterior de la vida. Alrededor del 30% al 50% sufrir diabetes posteriormente, especialmente las que son obesas. DIAGNSTICO Las hormonas que causan resistencia a la insulina tienen su mayor nivel alrededor de las 24 a 28 semanas del embarazo. Si se experimentan sntomas, stos son similares a los sntomas que normalmente aparecen durante el embarazo.  La diabetes mellitus gestacional generalmente se diagnostica por medio de un mtodo en dos partes: 1. Despus de la 24 a 28 semanas de embarazo, la mujer debe beber una solucin que contiene glucosa y realizar un anlisis de sangre. Si el nivel de glucosa es elevado, la realizarn un segundo anlisis. 2. La prueba oral de tolerancia a la glucosa, que dura aproximadamente tres horas. Despus de realizar ayuno durante la noche, se controla nivel de glucosa en sangre. La mujer bebe una solucin que contiene glucosa y le realizan anlisis de glucosa en sangre cada hora. Si la mujer tiene factores de riesgos para la diabetes mellitus gestacional, el mdico podr indicar el anlisis antes de las 24 semanas de embarazo. TRATAMIENTO El tratamiento est dirigido a mantener la glucosa en sangre de la madre en un nivel normal y puede incluir:  La   planificacin de los alimentos.  Recibir insulina u otro medicamento para controlar el nivel de glucosa en sangre.  La prctica de ejercicios.  Llevar un  registro diario de los alimentos que consume.  Control y registro de los niveles de glucosa en sangre.  Control de los niveles de cetona en la orina, aunque esto ya no se considera necesario en la mayora de los embarazos. INSTRUCCIONES PARA EL CUIDADO DOMICILIARIO Mientras est embarazada:  Siga los consejos de su mdico relacionados con los controles prenatales, la planificacin de la comida, la actividad fsica, los medicamentos, vitaminas, los anlisis de sangre y otras pruebas y las actividades fsicas.  Lleve un registro de las comidas, las pruebas de glucosa en sangre y la cantidad de insulina que recibe (si corresponde). Muestre todo al profesional en cada consulta mdica prenatal.  Si sufre diabetes mellitus gestacional, podr tener problemas de hipoglucemia (nivel bajo de glucosa en sangre). Podr sospechar este problema si se siente repentinamente mareada, tiene temblores y/o se siente dbil. Si cree que esto le est ocurriendo, y tiene un medidor de glucosa, mida su nivel de glucosa en sangre. Siga los consejos de su mdico sobre el modo y el momento de tratar su nivel de glucosa en sangre. Generalmente se sigue la regla 15:15 Consuma 15 g de hidratos de carbono, espere 15 minutos y vuelva controlar el nivel de glucosa en sangre.. Ejemplos de 15 g de hidratos de carbono son:  1 taza de leche descremada.   taza de jugo.  3-4 tabletas de glucosa.  5-6 caramelos duros.  1 caja pequea de pasas de uva.   taza de gaseosa comn.  Mantenga una buena higiene para evitar infecciones.  No fume. SOLICITE ATENCIN MDICA SI:  Observa prdida vaginal con o sin picazn.  Se siente ms dbil o cansada que lo habitual.  Transpira mucho.  Tiene un aumento de peso repentino, 2,5 kg o ms en una semana.  Pierde peso, 1.5 kg o ms en una semana.  Su nivel de glucosa en sangre es elevado, necesita instrucciones. SOLICITE ATENCIN MDICA DE INMEDIATO SI:  Sufre una cefalea  intensa.  Se marea o pierde el conocimiento  Presenta nuseas o vmitos.  Se siente desorientada confundida.  Sufre convulsiones.  Tiene problemas de visin.  Siente dolor en el estmago.  Presenta una hemorragia vaginal abundante.  Tiene contracciones uterinas.  Tiene una prdida importante de lquido por la vagina DESPUS QUE NACE EL BEB:  Concurra a todos los controles de seguimiento y realice los anlisis de sangre segn las indicaciones de su mdico.  Mantenga un estilo de vida saludable para evitar la diabetes en el futuro. Aqu se incluye:  Siga el plan de alimentacin saludable.  Controle su peso.  Practique actividad fsica y descanse lo necesario.  No fume.  Amamante a su beb mientras pueda. Esto disminuir la probabilidad de que usted y su beb sufran diabetes posteriormente. Para ms informacin acerca de la diabetes, visite la pgina web de la American Diabetes Association: www.americandiabetesassociation.org. Para ms informacin acerca de la diabetes gestacional cite la pgina web del American Congress of Obstetricians and Gynecologists en: www.acog.org. Document Released: 10/07/2004 Document Revised: 03/22/2011 [employer] Patient Information 2013 [employer].  Lactancia materna  (Breastfeeding) Decidir amamantar es una de las mejores elecciones que puede hacer por usted y su beb. La informacin que se brinda a continuacin le dar una breve visin de los beneficios de la lactancia materna as como de las dudas ms frecuentes alrededor de ella.    LOS BENEFICIOS DE AMAMANTAR  Para el beb   La primera leche (calostro ) ayuda al mejor funcionamiento del sistema digestivo del beb.   La leche tiene anticuerpos que provienen de la madre y que ayudan a prevenir las infecciones en el beb.   El beb tiene una menor incidencia de asma, alergias y del sndrome de muerte sbita del lactante (SMSL).   Los nutrientes en la leche materna son mejores para  el beb que los preparados para lactantes y la leche materna ayuda a un mejor desarrollo del cerebro del beb.   Los bebs amamantados tienen menos gases, clicos y estreimiento.  Para la mam   La lactancia materna favorece el desarrollo de un vnculo muy especial entre la madre y el beb.   Es ms conveniente, siempre disponible y a la temperatura adecuada y econmico.   Consume caloras en la madre y la ayuda a perder el peso ganado durante el embarazo.   Favorece la contraccin del tero a su tamao normal, de manera ms rpida y disminuye las hemorragias luego del parto.   Las madres que amamantan tienen menor riesgo de desarrollar cncer de mama.  FRECUENCIA DEL AMAMANTAMIENTO   Un beb sano, nacido a trmino, puede amamantarse con tanta frecuencia como cada hora, o espaciar las comidas cada tres horas.   Observe al beb cuando manifieste signos de hambre. Amamante a su beb si muestra signos de hambre. Esta frecuencia variar de un beb a otro.   Amamntelo tan seguido como el beb lo solicite, o cuando usted sienta la necesidad de aliviar sus mamas.   Despierte al beb si han pasado 3  4 horas desde la ltima comida.   El amamantamiento frecuente la ayudar a producir ms leche y a prevenir problemas de dolor en los pezones e hinchazn de las mamas.  POSICIN DEL BEBE PARA EL AMAMANTAMIENTO   Ya sea que se encuentre acostada o sentada, asegrese que el abdomen del beb enfrente el suyo.   Sostenga la mama con el pulgar por arriba y los otros 4 dedos por debajo. Asegrese que sus dedos se encuentren lejos del pezn y de la boca del beb.   Empuje suavemente los labios del beb con el pezn o con el dedo.   Cuando la boca del beb se abra lo suficiente, introduzca el pezn y la areola tanto como le sea posible dentro de la boca.   Coloque al beb cerca suyo de modo que su nariz y mejillas toquen las mamas al mamar.  ALIMENTACIN Y SUCCIN   La duracin  de cada comida vara de un beb a otro y de una comida a otra.   El beb debe succionar entre 2 y 3 minutos para que le llegue leche. Esto se denomina "bajada". Por este motivo, permita que el nio se alimente en cada mama todo lo que desee. Terminar de mamar cuando haya recibido la cantidad adecuada de nutrientes.   Para detener la succin coloque su dedo en la comisura de la boca del nio y deslcelo entre sus encas antes de quitarle la mama de la boca. Esto la ayudar a evitar el dolor en los pezones.  COMO SABER SI EL BEB OBTIENE LA SUFICIENTE LECHE MATERNA  Preguntarse si el beb obtiene la cantidad suficiente de leche es una preocupacin frecuente entre las madres. Puede asegurarse que el beb tiene la leche suficiente si:   El beb succiona activamente y usted escucha que traga .   El beb parece estar   relajado y satisfecho despus de mamar.   El nio se alimenta al menos 8 a 12 veces en 24 horas. Alimntelo hasta que se desprenda por sus propios medios o se quede dormido en la primera mama (al menos durante 10 a 20 minutos), luego ofrzcale el otro lado.   El beb moja 5 a 6 paales desechables (6 a 8 paales de tela) en 24 horas cuando tiene 5  6 das de vida.   Tiene al menos 3 a 4 deposiciones todos los das en los primeros meses. La materia fecal debe ser blanda y amarillenta.   El beb debe aumentar 4 a 6 libras (120 a 170 gr.) por semana despus de los 4 das de vida.   Siente que las mamas se ablandan despus de amamantar  REDUCIR LA CONGESTIN DE LAS MAMAS   Durante la primera semana despus del parto, usted puede experimentar hinchazn en las mamas. Cuando las mamas estn congestionadas, se sienten calientes, llenas y molestas al tacto. Puede reducir la congestin si:   Lo amamanta frecuentemente, cada 2-3 horas. Las mams que amamantan pronto y con frecuencia tienen menos problemas de congestin.   Coloque compresas de hielo en sus mamas durante 10-20  minutos entre cada amamantamiento. Esto ayuda a reducir la hinchazn. Envuelva las bolsas de hielo en una toalla liviana para proteger su piel. Las bolsas de vegetales congelados funcionan bien para este propsito.   Tome una ducha tibia o aplique compresas hmedas calientes en las mamas durante 5 a 10 minutos antes de cada vez que amamanta. Esto aumenta la circulacin y ayuda a que la leche fluya.   Masajee suavemente la mama antes y durante la alimentacin. Con las puntas de los dedos, masajee desde la pared torcica hacia abajo hasta llegar al pezn, con movimientos circulares.   Asegrese que el nio vaca al menos una mama antes de cambiar de lado.   Use un sacaleche para vaciar la mama si el beb se duerme o no se alimenta bien. Tambin podr quitarse la leche con esa bomba si tiene que volver al trabajo o siente que las mamas estn congestionadas.   Evite los biberones, chupetes o complementar la alimentacin con agua o jugos en lugar de la leche materna. La leche materna es todo el alimento que el beb necesita. No es necesario que el nio ingiera agua o preparados de bibern. De hecho, es lo mejor para ayudar a que las mamas produzcan ms leche. no darle suplementos al nio durante las primeras semanas.   Verifique que el beb se encuentra en la posicin correcta mientras lo alimenta.   Use un sostn que soporte bien sus mamas y evite los que tienen aro.   Consuma una dieta balanceada y beba lquidos en cantidad.   Descanse con frecuencia, reljese y tome sus vitaminas prenatales para evitar la fatiga, el estrs y la anemia.  Si sigue estas indicaciones, la congestin debe mejorar en 24 a 48 horas. Si an tiene dificultades, consulte a su asesor en lactancia.  CUDESE USTED MISMA  Cuide sus mamas.   Bese o dchese diariamente.   Evite usar jabn en los pezones.   Comience a amamantar del lado izquierdo en una comida y del lado derecho en la siguiente.   Notar que  aumenta el flujo de leche a los 2 a 5 das despus del parto. Puede sentir algunas molestias por la congestin, lo que hace que sus mamas estn duras y sensibles. La congestin disminuye en 24 a 48   horas. Mientras tanto, aplique toallas hmedas calientes durante 5 a 10 minutos antes de amamantar. Un masaje suave y la extraccin de un poco de leche antes de amamantar ablandarn las mamas y har ms fcil que el beb se agarre.   Use un buen sostn y seque al aire los pezones durante 3 a 4 minutos luego de cada alimentacin.   Solo utilice apsitos de algodn.   Utilice lanolina pura sobre los pezones luego de amamantar. No necesita lavarlos luego de alimentar al nio. Otra opcin es exprimir algunas gotas de leche y masajear suavemente los pezones.  Cumpla con estos cuidados   Consuma alimentos bien balanceados y refrigerios nutritivos.   Beba leche, jugos de fruta y agua para satisfacer la sed (alrededor de 8 vasos por da).   Descanse lo suficiente.  Evite los alimentos que usted note que pueden afectar al beb.  SOLICITE ATENCIN MDICA SI:   Tiene dificultad con la lactancia materna y necesita ayuda.   Tiene una zona de color rojo, dura y dolorosa en la mama que se acompaa de fiebre.   El beb est muy somnoliento como para alimentarse bien o tiene problemas para dormir.   Su beb moja menos de 6 paales al da, a los 5 das de vida.   La piel del beb o la parte blanca de sus ojos est ms amarilla de lo que estaba en el hospital.   Se siente deprimida.  Document Released: 12/28/2004 Document Revised: 06/29/2011 [employer] Patient Information 2013 [employer].  

## 2011-11-29 NOTE — Progress Notes (Signed)
Pulse- 87 

## 2011-11-30 LAB — CULTURE, OB URINE: Colony Count: 80000

## 2011-12-02 ENCOUNTER — Ambulatory Visit (INDEPENDENT_AMBULATORY_CARE_PROVIDER_SITE_OTHER): Payer: Self-pay | Admitting: General Practice

## 2011-12-02 VITALS — BP 113/70 | Wt 178.2 lb

## 2011-12-02 DIAGNOSIS — O09529 Supervision of elderly multigravida, unspecified trimester: Secondary | ICD-10-CM

## 2011-12-02 DIAGNOSIS — O99810 Abnormal glucose complicating pregnancy: Secondary | ICD-10-CM

## 2011-12-02 DIAGNOSIS — O24419 Gestational diabetes mellitus in pregnancy, unspecified control: Secondary | ICD-10-CM

## 2011-12-02 DIAGNOSIS — IMO0002 Reserved for concepts with insufficient information to code with codable children: Secondary | ICD-10-CM

## 2011-12-02 NOTE — Progress Notes (Signed)
Pulse: 91

## 2011-12-06 ENCOUNTER — Ambulatory Visit (INDEPENDENT_AMBULATORY_CARE_PROVIDER_SITE_OTHER): Payer: Self-pay | Admitting: Obstetrics & Gynecology

## 2011-12-06 VITALS — BP 125/79 | Wt 179.4 lb

## 2011-12-06 DIAGNOSIS — O99810 Abnormal glucose complicating pregnancy: Secondary | ICD-10-CM

## 2011-12-06 DIAGNOSIS — O24419 Gestational diabetes mellitus in pregnancy, unspecified control: Secondary | ICD-10-CM

## 2011-12-06 DIAGNOSIS — O0990 Supervision of high risk pregnancy, unspecified, unspecified trimester: Secondary | ICD-10-CM

## 2011-12-06 LAB — OB RESULTS CONSOLE GC/CHLAMYDIA
Chlamydia: NEGATIVE
Gonorrhea: NEGATIVE

## 2011-12-06 LAB — POCT URINALYSIS DIP (DEVICE)
Glucose, UA: 250 mg/dL — AB
Ketones, ur: NEGATIVE mg/dL
Specific Gravity, Urine: >=1.03 (ref 1.005–1.030)
Urobilinogen, UA: 0.2 mg/dL (ref 0.0–1.0)

## 2011-12-06 NOTE — Progress Notes (Signed)
FBS<100, pp <130 NST reactive [place] growth in 2 weeks

## 2011-12-06 NOTE — Progress Notes (Signed)
U/S scheduled 12/16/11 at 1130 am.

## 2011-12-06 NOTE — Progress Notes (Signed)
P-[id number] 

## 2011-12-06 NOTE — Patient Instructions (Addendum)
Diabetes mellitus gestacional (Gestational Diabetes Mellitus) La diabetes mellitus gestacional se produce slo durante el embarazo. Aparece cuando el organismo no puede controlar adecuadamente la glucosa (azcar) que aumenta en la sangre despus de comer. Durante el embarazo, se produce una resistencia a la insulina (sensibilidad reducida a la insulina) debido a la liberacin de hormonas por parte de la placenta. Generalmente, el pncreas de una mujer embarazada produce la cantidad suficiente de insulina para vencer esa resistencia. Sin embargo, en la diabetes gestacional, hay insulina pero no cumple su funcin adecuadamente. Si la resistencia es lo suficientemente grave como para que el pncreas no produzca la cantidad de insulina suficiente, la glucosa extra se acumula en la sangre.  QUINES TIENEN RIESGO DE DESARROLLAR DIABETES GESTACIONAL?  Las mujeres con historia de diabetes en la familia.  Las mujeres de ms de 25 aos.  Las que presentan sobrepeso.  Las mujeres que pertenecen a ciertos grupos tnicos (latinas, afroamericanas, norteamericanas nativas, asiticas y las originarias de las islas del [employer]. QUE PUEDE OCURRIRLE AL BEB? Si el nivel de glucosa en sangre de la madre es demasiado elevado mientras este embarazada, el nivel extra de azcar pasar por el cordn umbilical hacia el beb. Algunos de los problemas del beb pueden ser:  Beb demasiado grande: si el nio recibe demasiada azcar, puede aumentar mucho de peso. Esto puede hacer que sea demasiado grande para nacer por parto normal (vaginal) por lo que ser necesario realizar una cesrea.  Bajo nivel de glucosa (hipoglucemia): el beb produce insulina extra en respuesta a la excesiva cantidad de azcar que obtiene de la madre. Cuando el beb nace y ya no necesita insulina extra, su nivel de azcar en sangre puede disminuir.  Ictericia (coloracin amarillenta de la piel y los ojos): esto es bastante frecuente en los bebs. La  causa es la acumulacin de una sustancia qumica denominada bilirrubina. No siempre es un trastorno grave, pero se observa con frecuencia en los bebs cuyas madres sufren diabetes gestacional. RIESGOS PARA LA MADRE Las mujeres que han sufrido diabetes gestacional pueden tener ms riesgos para algunos problemas como:  Preeclampsia o toxemia, incluyendo problemas con hipertensin arterial. La presin arterial y los niveles de protenas en la orina deben controlarse con frecuencia.  Infecciones  Parto por cesrea.  Aparicin de diabetes tipo 2 en una etapa posterior de la vida. Alrededor del 30% al 50% sufrir diabetes posteriormente, especialmente las que son obesas. DIAGNSTICO Las hormonas que causan resistencia a la insulina tienen su mayor nivel alrededor de las 24 a 28 semanas del embarazo. Si se experimentan sntomas, stos son similares a los sntomas que normalmente aparecen durante el embarazo.  La diabetes mellitus gestacional generalmente se diagnostica por medio de un mtodo en dos partes: 1. Despus de la 24 a 28 semanas de embarazo, la mujer debe beber una solucin que contiene glucosa y realizar un anlisis de sangre. Si el nivel de glucosa es elevado, la realizarn un segundo anlisis. 2. La prueba oral de tolerancia a la glucosa, que dura aproximadamente tres horas. Despus de realizar ayuno durante la noche, se controla nivel de glucosa en sangre. La mujer bebe una solucin que contiene glucosa y le realizan anlisis de glucosa en sangre cada hora. Si la mujer tiene factores de riesgos para la diabetes mellitus gestacional, el mdico podr indicar el anlisis antes de las 24 semanas de embarazo. TRATAMIENTO El tratamiento est dirigido a mantener la glucosa en sangre de la madre en un nivel normal y puede incluir:  La   planificacin de los alimentos.  Recibir insulina u otro medicamento para controlar el nivel de glucosa en sangre.  La prctica de ejercicios.  Llevar un  registro diario de los alimentos que consume.  Control y registro de los niveles de glucosa en sangre.  Control de los niveles de cetona en la orina, aunque esto ya no se considera necesario en la mayora de los embarazos. INSTRUCCIONES PARA EL CUIDADO DOMICILIARIO Mientras est embarazada:  Siga los consejos de su mdico relacionados con los controles prenatales, la planificacin de la comida, la actividad fsica, los medicamentos, vitaminas, los anlisis de sangre y otras pruebas y las actividades fsicas.  Lleve un registro de las comidas, las pruebas de glucosa en sangre y la cantidad de insulina que recibe (si corresponde). Muestre todo al profesional en cada consulta mdica prenatal.  Si sufre diabetes mellitus gestacional, podr tener problemas de hipoglucemia (nivel bajo de glucosa en sangre). Podr sospechar este problema si se siente repentinamente mareada, tiene temblores y/o se siente dbil. Si cree que esto le est ocurriendo, y tiene un medidor de glucosa, mida su nivel de glucosa en sangre. Siga los consejos de su mdico sobre el modo y el momento de tratar su nivel de glucosa en sangre. Generalmente se sigue la regla 15:15 Consuma 15 g de hidratos de carbono, espere 15 minutos y vuelva controlar el nivel de glucosa en sangre.. Ejemplos de 15 g de hidratos de carbono son:  1 taza de leche descremada.   taza de jugo.  3-4 tabletas de glucosa.  5-6 caramelos duros.  1 caja pequea de pasas de uva.   taza de gaseosa comn.  Mantenga una buena higiene para evitar infecciones.  No fume. SOLICITE ATENCIN MDICA SI:  Observa prdida vaginal con o sin picazn.  Se siente ms dbil o cansada que lo habitual.  Transpira mucho.  Tiene un aumento de peso repentino, 2,5 kg o ms en una semana.  Pierde peso, 1.5 kg o ms en una semana.  Su nivel de glucosa en sangre es elevado, necesita instrucciones. SOLICITE ATENCIN MDICA DE INMEDIATO SI:  Sufre una cefalea  intensa.  Se marea o pierde el conocimiento  Presenta nuseas o vmitos.  Se siente desorientada confundida.  Sufre convulsiones.  Tiene problemas de visin.  Siente dolor en el estmago.  Presenta una hemorragia vaginal abundante.  Tiene contracciones uterinas.  Tiene una prdida importante de lquido por la vagina DESPUS QUE NACE EL BEB:  Concurra a todos los controles de seguimiento y realice los anlisis de sangre segn las indicaciones de su mdico.  Mantenga un estilo de vida saludable para evitar la diabetes en el futuro. Aqu se incluye:  Siga el plan de alimentacin saludable.  Controle su peso.  Practique actividad fsica y descanse lo necesario.  No fume.  Amamante a su beb mientras pueda. Esto disminuir la probabilidad de que usted y su beb sufran diabetes posteriormente. Para ms informacin acerca de la diabetes, visite la pgina web de la American Diabetes Association: www.americandiabetesassociation.org. Para ms informacin acerca de la diabetes gestacional cite la pgina web del American Congress of Obstetricians and Gynecologists en: www.acog.org. Document Released: 10/07/2004 Document Revised: 03/22/2011 Exit[employer] Patient Information 2013 [employer].  

## 2011-12-08 ENCOUNTER — Ambulatory Visit (INDEPENDENT_AMBULATORY_CARE_PROVIDER_SITE_OTHER): Payer: Self-pay | Admitting: *Deleted

## 2011-12-08 VITALS — BP 127/74 | Wt 178.2 lb

## 2011-12-08 DIAGNOSIS — O99810 Abnormal glucose complicating pregnancy: Secondary | ICD-10-CM

## 2011-12-08 NOTE — Progress Notes (Signed)
P = 98   [place] growth scheduled on 12/20/11 @ 0815

## 2011-12-08 NOTE — Progress Notes (Signed)
NST reviewed and reactive.  

## 2011-12-13 ENCOUNTER — Ambulatory Visit (INDEPENDENT_AMBULATORY_CARE_PROVIDER_SITE_OTHER): Payer: Self-pay | Admitting: Obstetrics & Gynecology

## 2011-12-13 VITALS — BP 129/79 | Temp 97.4°F | Wt 180.1 lb

## 2011-12-13 DIAGNOSIS — O0990 Supervision of high risk pregnancy, unspecified, unspecified trimester: Secondary | ICD-10-CM

## 2011-12-13 DIAGNOSIS — IMO0002 Reserved for concepts with insufficient information to code with codable children: Secondary | ICD-10-CM

## 2011-12-13 DIAGNOSIS — O24419 Gestational diabetes mellitus in pregnancy, unspecified control: Secondary | ICD-10-CM

## 2011-12-13 DIAGNOSIS — O99810 Abnormal glucose complicating pregnancy: Secondary | ICD-10-CM

## 2011-12-13 DIAGNOSIS — O0930 Supervision of pregnancy with insufficient antenatal care, unspecified trimester: Secondary | ICD-10-CM

## 2011-12-13 DIAGNOSIS — O09529 Supervision of elderly multigravida, unspecified trimester: Secondary | ICD-10-CM

## 2011-12-13 LAB — POCT URINALYSIS DIP (DEVICE)
Glucose, UA: 100 mg/dL — AB
Hgb urine dipstick: NEGATIVE
Specific Gravity, Urine: >=1.03 (ref 1.005–1.030)
Urobilinogen, UA: 1 mg/dL (ref 0.0–1.0)

## 2011-12-13 MED ORDER — GLYBURIDE 2.5 MG PO TABS: 2.5000 mg | ORAL_TABLET | Freq: Every day | ORAL | Status: DC

## 2011-12-13 NOTE — Progress Notes (Signed)
Pulse 92 Needs refill on glyburide

## 2011-12-13 NOTE — Progress Notes (Signed)
Next [place] for growth scheduled on 12/9

## 2011-12-13 NOTE — Progress Notes (Signed)
Routine visit. Good FM. NST reactive. GBS negative. No OB problems. Labor precautions reviewed.

## 2011-12-16 ENCOUNTER — Ambulatory Visit (HOSPITAL_COMMUNITY): Payer: Self-pay

## 2011-12-16 ENCOUNTER — Other Ambulatory Visit (HOSPITAL_COMMUNITY): Payer: Self-pay

## 2011-12-16 ENCOUNTER — Ambulatory Visit (INDEPENDENT_AMBULATORY_CARE_PROVIDER_SITE_OTHER): Payer: Self-pay | Admitting: *Deleted

## 2011-12-16 VITALS — BP 116/65 | Temp 98.4°F | Wt 178.3 lb

## 2011-12-16 DIAGNOSIS — O99810 Abnormal glucose complicating pregnancy: Secondary | ICD-10-CM

## 2011-12-16 NOTE — Progress Notes (Signed)
P=77 , here for NST , Interpreter Nettie Elm.

## 2011-12-16 NOTE — Progress Notes (Signed)
NST reactive on 12/02/11

## 2011-12-20 ENCOUNTER — Ambulatory Visit (HOSPITAL_COMMUNITY)
Admission: RE | Admit: 2011-12-20 | Discharge: 2011-12-20 | Disposition: A | Discharge status: Home / Self Care | Payer: Self-pay | Source: Ambulatory Visit | Attending: Obstetrics & Gynecology | Admitting: Obstetrics & Gynecology

## 2011-12-20 ENCOUNTER — Ambulatory Visit (INDEPENDENT_AMBULATORY_CARE_PROVIDER_SITE_OTHER): Payer: Self-pay | Admitting: Obstetrics & Gynecology

## 2011-12-20 ENCOUNTER — Telehealth (HOSPITAL_COMMUNITY): Payer: Self-pay | Admitting: *Deleted

## 2011-12-20 VITALS — BP 117/74 | Temp 98.8°F | Wt 179.1 lb

## 2011-12-20 DIAGNOSIS — O0930 Supervision of pregnancy with insufficient antenatal care, unspecified trimester: Secondary | ICD-10-CM | POA: Insufficient documentation

## 2011-12-20 DIAGNOSIS — O09529 Supervision of elderly multigravida, unspecified trimester: Secondary | ICD-10-CM | POA: Insufficient documentation

## 2011-12-20 DIAGNOSIS — O99810 Abnormal glucose complicating pregnancy: Secondary | ICD-10-CM | POA: Insufficient documentation

## 2011-12-20 DIAGNOSIS — Z23 Encounter for immunization: Secondary | ICD-10-CM

## 2011-12-20 DIAGNOSIS — O0990 Supervision of high risk pregnancy, unspecified, unspecified trimester: Secondary | ICD-10-CM

## 2011-12-20 DIAGNOSIS — O09299 Supervision of pregnancy with other poor reproductive or obstetric history, unspecified trimester: Secondary | ICD-10-CM | POA: Insufficient documentation

## 2011-12-20 DIAGNOSIS — O139 Gestational [pregnancy-induced] hypertension without significant proteinuria, unspecified trimester: Secondary | ICD-10-CM

## 2011-12-20 LAB — POCT URINALYSIS DIP (DEVICE)
Bilirubin Urine: NEGATIVE
Glucose, UA: NEGATIVE mg/dL
Nitrite: NEGATIVE
Urobilinogen, UA: 0.2 mg/dL (ref 0.0–1.0)

## 2011-12-20 MED ORDER — TETANUS-DIPHTH-ACELL PERTUSSIS 5-2.5-18.5 LF-MCG/0.5 IM SUSP
0.5000 mL | Freq: Once | INTRAMUSCULAR | Status: AC
  Administered 2011-12-20: 0.5 mL via INTRAMUSCULAR

## 2011-12-20 NOTE — Progress Notes (Signed)
[place] growth done today.  Pt has questions about Tdap vaccine.   IOL scheduled 12/27/11 @ 0630

## 2011-12-20 NOTE — Progress Notes (Signed)
P=79, Used Interpreter [employer]. Declines flu shot.

## 2011-12-20 NOTE — Progress Notes (Signed)
NST reactive.  CBGs are <129, dinner has the most around 120-125.  EFW 7 pounds 2 ounces at 38 weeks.  Induction on 12/27/11

## 2011-12-20 NOTE — Telephone Encounter (Signed)
Preadmission screen Interpreter number [phone]

## 2011-12-23 ENCOUNTER — Ambulatory Visit (INDEPENDENT_AMBULATORY_CARE_PROVIDER_SITE_OTHER): Payer: Self-pay | Admitting: *Deleted

## 2011-12-23 VITALS — BP 124/81 | Wt 178.1 lb

## 2011-12-23 DIAGNOSIS — O99810 Abnormal glucose complicating pregnancy: Secondary | ICD-10-CM

## 2011-12-23 NOTE — Progress Notes (Signed)
P = 90.   IOL on 12/16- all pt questions answered. Interpreter- Genella Rife present.

## 2011-12-27 ENCOUNTER — Inpatient Hospital Stay (HOSPITAL_COMMUNITY)
Admission: AD | Admit: 2011-12-27 | Discharge: 2011-12-28 | DRG: 775 | Disposition: A | Discharge status: Home / Self Care | Payer: Medicaid Other | Source: Ambulatory Visit | Attending: Obstetrics & Gynecology | Admitting: Obstetrics & Gynecology

## 2011-12-27 ENCOUNTER — Encounter (HOSPITAL_COMMUNITY): Payer: Self-pay | Admitting: *Deleted

## 2011-12-27 ENCOUNTER — Inpatient Hospital Stay (HOSPITAL_COMMUNITY): Admission: RE | Admit: 2011-12-27 | Payer: Self-pay | Source: Ambulatory Visit

## 2011-12-27 DIAGNOSIS — O09529 Supervision of elderly multigravida, unspecified trimester: Secondary | ICD-10-CM

## 2011-12-27 DIAGNOSIS — O0990 Supervision of high risk pregnancy, unspecified, unspecified trimester: Secondary | ICD-10-CM

## 2011-12-27 DIAGNOSIS — O24419 Gestational diabetes mellitus in pregnancy, unspecified control: Secondary | ICD-10-CM

## 2011-12-27 DIAGNOSIS — O99814 Abnormal glucose complicating childbirth: Principal | ICD-10-CM | POA: Diagnosis present

## 2011-12-27 DIAGNOSIS — O0930 Supervision of pregnancy with insufficient antenatal care, unspecified trimester: Secondary | ICD-10-CM

## 2011-12-27 DIAGNOSIS — IMO0002 Reserved for concepts with insufficient information to code with codable children: Secondary | ICD-10-CM

## 2011-12-27 LAB — CBC
MCH: 29.9 pg (ref 26.0–34.0)
MCHC: 32.7 g/dL (ref 30.0–36.0)
MCV: 91.5 fL (ref 78.0–100.0)
Platelets: 245 10*3/uL (ref 150–400)
RBC: 3.98 MIL/uL (ref 3.87–5.11)

## 2011-12-27 LAB — ABO/RH: ABO/RH(D): O POS

## 2011-12-27 LAB — TYPE AND SCREEN: ABO/RH(D): O POS

## 2011-12-27 LAB — GLUCOSE, CAPILLARY: Glucose-Capillary: 103 mg/dL — ABNORMAL HIGH (ref 70–99)

## 2011-12-27 LAB — RAPID HIV SCREEN (WH-MAU): Rapid HIV Screen: NONREACTIVE

## 2011-12-27 MED ORDER — FLEET ENEMA 7-19 GM/118ML RE ENEM: 1.0000 | ENEMA | RECTAL | Status: DC | PRN

## 2011-12-27 MED ORDER — TETANUS-DIPHTH-ACELL PERTUSSIS 5-2.5-18.5 LF-MCG/0.5 IM SUSP: 0.5000 mL | Freq: Once | INTRAMUSCULAR | Status: DC

## 2011-12-27 MED ORDER — SIMETHICONE 80 MG PO CHEW: 80.0000 mg | CHEWABLE_TABLET | ORAL | Status: DC | PRN

## 2011-12-27 MED ORDER — IBUPROFEN 600 MG PO TABS
600.0000 mg | ORAL_TABLET | Freq: Four times a day (QID) | ORAL | Status: DC
  Administered 2011-12-27 – 2011-12-28 (×4): 600 mg via ORAL
  Filled 2011-12-27 (×4): qty 1

## 2011-12-27 MED ORDER — CITRIC ACID-SODIUM CITRATE 334-500 MG/5ML PO SOLN: 30.0000 mL | ORAL | Status: DC | PRN

## 2011-12-27 MED ORDER — ONDANSETRON HCL 4 MG/2ML IJ SOLN: 4.0000 mg | INTRAMUSCULAR | Status: DC | PRN

## 2011-12-27 MED ORDER — ZOLPIDEM TARTRATE 5 MG PO TABS: 5.0000 mg | ORAL_TABLET | Freq: Every evening | ORAL | Status: DC | PRN

## 2011-12-27 MED ORDER — BENZOCAINE-MENTHOL 20-0.5 % EX AERO: 1.0000 "application " | INHALATION_SPRAY | CUTANEOUS | Status: DC | PRN

## 2011-12-27 MED ORDER — OXYCODONE-ACETAMINOPHEN 5-325 MG PO TABS
1.0000 | ORAL_TABLET | ORAL | Status: DC | PRN
  Administered 2011-12-28: 1 via ORAL
  Filled 2011-12-27: qty 1

## 2011-12-27 MED ORDER — SENNOSIDES-DOCUSATE SODIUM 8.6-50 MG PO TABS
2.0000 | ORAL_TABLET | Freq: Every day | ORAL | Status: DC
  Administered 2011-12-27: 2 via ORAL

## 2011-12-27 MED ORDER — WITCH HAZEL-GLYCERIN EX PADS: 1.0000 "application " | MEDICATED_PAD | CUTANEOUS | Status: DC | PRN

## 2011-12-27 MED ORDER — LIDOCAINE HCL (PF) 1 % IJ SOLN
30.0000 mL | INTRAMUSCULAR | Status: DC | PRN
  Administered 2011-12-27: 30 mL via SUBCUTANEOUS
  Filled 2011-12-27: qty 30

## 2011-12-27 MED ORDER — LACTATED RINGERS IV SOLN: 500.0000 mL | INTRAVENOUS | Status: DC | PRN

## 2011-12-27 MED ORDER — INFLUENZA VIRUS VACC SPLIT PF IM SUSP
0.5000 mL | INTRAMUSCULAR | Status: AC
  Administered 2011-12-28: 0.5 mL via INTRAMUSCULAR
  Filled 2011-12-27: qty 0.5

## 2011-12-27 MED ORDER — LANOLIN HYDROUS EX OINT: TOPICAL_OINTMENT | CUTANEOUS | Status: DC | PRN

## 2011-12-27 MED ORDER — ONDANSETRON HCL 4 MG PO TABS: 4.0000 mg | ORAL_TABLET | ORAL | Status: DC | PRN

## 2011-12-27 MED ORDER — PRENATAL MULTIVITAMIN CH
1.0000 | ORAL_TABLET | Freq: Every day | ORAL | Status: DC
  Administered 2011-12-27 – 2011-12-28 (×2): 1 via ORAL
  Filled 2011-12-27 (×2): qty 1

## 2011-12-27 MED ORDER — ACETAMINOPHEN 325 MG PO TABS: 650.0000 mg | ORAL_TABLET | ORAL | Status: DC | PRN

## 2011-12-27 MED ORDER — MISOPROSTOL 25 MCG QUARTER TABLET
25.0000 ug | ORAL_TABLET | ORAL | Status: DC
  Administered 2011-12-27 (×2): 25 ug via VAGINAL
  Filled 2011-12-27 (×2): qty 0.25

## 2011-12-27 MED ORDER — LACTATED RINGERS IV SOLN
INTRAVENOUS | Status: DC
  Administered 2011-12-27 (×2): via INTRAVENOUS

## 2011-12-27 MED ORDER — DIPHENHYDRAMINE HCL 25 MG PO CAPS: 25.0000 mg | ORAL_CAPSULE | Freq: Four times a day (QID) | ORAL | Status: DC | PRN

## 2011-12-27 MED ORDER — OXYTOCIN BOLUS FROM INFUSION
500.0000 mL | INTRAVENOUS | Status: DC
  Administered 2011-12-27: 500 mL via INTRAVENOUS

## 2011-12-27 MED ORDER — ONDANSETRON HCL 4 MG/2ML IJ SOLN: 4.0000 mg | Freq: Four times a day (QID) | INTRAMUSCULAR | Status: DC | PRN

## 2011-12-27 MED ORDER — DIBUCAINE 1 % RE OINT: 1.0000 "application " | TOPICAL_OINTMENT | RECTAL | Status: DC | PRN

## 2011-12-27 MED ORDER — OXYTOCIN 40 UNITS IN LACTATED RINGERS INFUSION - SIMPLE MED
62.5000 mL/h | INTRAVENOUS | Status: DC
  Filled 2011-12-27: qty 1000

## 2011-12-27 MED ORDER — OXYCODONE-ACETAMINOPHEN 5-325 MG PO TABS
1.0000 | ORAL_TABLET | ORAL | Status: DC | PRN
  Administered 2011-12-27: 1 via ORAL
  Filled 2011-12-27: qty 1

## 2011-12-27 MED ORDER — IBUPROFEN 600 MG PO TABS
600.0000 mg | ORAL_TABLET | Freq: Four times a day (QID) | ORAL | Status: DC | PRN
  Administered 2011-12-27: 600 mg via ORAL
  Filled 2011-12-27: qty 1

## 2011-12-27 NOTE — H&P (Signed)
Jasmine Vasquez is a 41 y.o. female presenting for IOL for A2GDM. Maternal Medical History:  Reason for admission: Reason for Admission:   nauseaIOL for A2 GDM  Fetal activity: Perceived fetal activity is normal.   Last perceived fetal movement was within the past hour.    Prenatal Complications - Diabetes: gestational. Diabetes is managed by oral agent (monotherapy).      OB History    Grav Para Term Preterm Abortions TAB SAB Ect Mult Living   4 3 3       3      Past Medical History  Diagnosis Date  . Hypertension   . Gestational diabetes     third pregnancy   History reviewed. No pertinent past surgical history. Family History: family history includes Hypertension in her brother and father. Social History:  reports that she has never smoked. She has never used smokeless tobacco. She reports that she does not drink alcohol or use illicit drugs.   Prenatal Transfer Tool  Maternal Diabetes: Yes:  Diabetes Type:  Insulin/Medication controlled Genetic Screening: Declined Maternal Ultrasounds/Referrals: Normal Fetal Ultrasounds or other Referrals:  None Maternal Substance Abuse:  No Significant Maternal Medications:  Meds include: Other: glyburide Significant Maternal Lab Results:  Lab values include: Group B Strep negative Other Comments:  None  Review of Systems  Constitutional: Negative for fever and chills.  Eyes: Negative for blurred vision and double vision.  Respiratory: Negative for shortness of breath.   Gastrointestinal: Negative for nausea, vomiting and abdominal pain.  Genitourinary: Negative for dysuria.  Musculoskeletal: Negative for back pain.  Neurological: Negative for dizziness and headaches.    Dilation: 1 Effacement (%): 50 Station: -2 Exam by:: L. McDnaiel RN Blood pressure 118/76, pulse 76, temperature 98.2 F (36.8 C), temperature source Oral, resp. rate 18, height 5\' 2"  (1.575 m), weight 80.74 kg (178 lb), last menstrual period  03/29/2011. Maternal Exam:  Abdomen: Fetal presentation: vertex  Introitus: Normal vulva. Normal vagina.  Pelvis: adequate for delivery.   Cervix: Cervix evaluated by digital exam.     Fetal Exam Fetal Monitor Review: Mode: ultrasound.   Baseline rate: 145.  Variability: moderate (6-25 bpm).   Pattern: accelerations present and no decelerations.    Fetal State Assessment: Category I - tracings are normal.     Physical Exam  Constitutional: She is oriented to person, place, and time. She appears well-developed and well-nourished. No distress.  HENT:  Head: Normocephalic and atraumatic.  Eyes: Conjunctivae normal and EOM are normal.  Neck: Normal range of motion.  Cardiovascular: Normal rate.   Respiratory: Effort normal and breath sounds normal. No respiratory distress.  GI: Soft. There is no tenderness. There is no rebound and no guarding.  Genitourinary: Vagina normal.  Musculoskeletal: Normal range of motion. She exhibits no edema and no tenderness.  Neurological: She is alert and oriented to person, place, and time.  Skin: Skin is warm and dry.  Psychiatric: She has a normal mood and affect.    Filed Vitals:   12/27/11 1150  BP: 113/73  Pulse: 73  Temp: 98.4 F (36.9 C)  Resp: 18    BMI 32  Prenatal labs: ABO, Rh: O/POS/-- (10/09 0944) Antibody: NEG (10/09 0944) Rubella: 13.4 (10/09 0944) RPR: NON REAC (10/09 0944)  HBsAg: NEGATIVE (10/09 0944)  HIV:    GBS: Negative (11/25 0000)   Assessment/Plan: 41 y.o. G4P3003 at [redacted]w[redacted]d with A2 GDM here for IOL - Cytotec, FB when able, then pitocin - Anticipate SVD. Pt has  delivered 9# baby previously by SVD  Napoleon Form 12/27/2011, 12:06 PM

## 2011-12-27 NOTE — H&P (Signed)
Agree with above. Evamaria Detore L. Harraway-Smith, M.D., FACOG  

## 2011-12-27 NOTE — Progress Notes (Signed)
Patient ID: LUSIA GREIS, female   DOB: 1970-09-09, 41 y.o.   MRN: [id number]  S:  Pt comfortable, not really feeling contractions.  O:   Filed Vitals:   12/27/11 1150  BP: 113/73  Pulse: 73  Temp: 98.4 F (36.9 C)  Resp: 18    Cervix:  1.5/50/-2, soft, mid  FHTs:  140, accels present, mod variability, no decels TOCO:  Occasional ctx  A/P Foley bulb placed, 2nd dose cytotec placed Anticipate SVD  CBG in 70s  Napoleon Form, MD 12/27/2011 12:30 PM

## 2011-12-27 NOTE — Progress Notes (Signed)
Patient ID: Jasmine Vasquez, female   DOB: [birth year], 41 y.o.   MRN: [id number]  Pt uncomfortable with contractions.  O:  Filed Vitals:   12/27/11 1538  BP: 107/57  Pulse: 75  Temp: 98.2 F (36.8 C)  Resp: 18   SROM at 3 PM Cervix:  5-6, very soft and stretchy, 80%, -1  FHTs:  140s, moderate variability, accels present, frequent variable decels to 100-110, 30 seconds, good recovery TOCO:  Ctx not picking up very well, q 3-4 minutes  A/P Foley bulb out. Will start pitocin if needed. Anticipate NSVD soon.  Napoleon Form, MD 12/27/2011 4:45 PM

## 2011-12-28 LAB — CBC
HCT: 33.4 % — ABNORMAL LOW (ref 36.0–46.0)
Hemoglobin: 11.1 g/dL — ABNORMAL LOW (ref 12.0–15.0)
MCHC: 33.2 g/dL (ref 30.0–36.0)
RDW: 13.5 % (ref 11.5–15.5)
WBC: 14.7 10*3/uL — ABNORMAL HIGH (ref 4.0–10.5)

## 2011-12-28 MED ORDER — IBUPROFEN 600 MG PO TABS: 600.0000 mg | ORAL_TABLET | Freq: Four times a day (QID) | ORAL | Status: DC

## 2011-12-28 NOTE — Progress Notes (Signed)
UR completed 

## 2011-12-28 NOTE — Progress Notes (Signed)
Post Partum Day 1 Subjective: no complaints, up ad lib, voiding and tolerating PO  Objective: Blood pressure 91/63, pulse 74, temperature 98.5 F (36.9 C), temperature source Oral, resp. rate 18, height 5\' 2"  (1.575 m), weight 80.74 kg (178 lb), last menstrual period 03/29/2011, SpO2 96.00%, unknown if currently breastfeeding.  Physical Exam:  General: alert, cooperative and no distress Lochia: appropriate Uterine Fundus: firm Incision: n/a DVT Evaluation: No evidence of DVT seen on physical exam. Negative Homan's sign. No cords or calf tenderness. No significant calf/ankle edema.   Basename 12/28/11 0535 12/27/11 0725  HGB 11.1* 11.9*  HCT 33.4* 36.4    Assessment/Plan: Wants to go home tonight if possible but likely tomorrow pending pediatrician release of baby. Breastfeeding   LOS: 1 day   Napoleon Form 12/28/2011, 7:56 AM

## 2011-12-28 NOTE — Discharge Summary (Signed)
Obstetric Discharge Summary  Jasmine Vasquez is a 41 y.o. [id number] presenting at 64 w gestation for induction of labor for A2 gestational diabetes. She received a dose of cytotec, foley bulb and pitocin and went on to complete dilation and a spontaneous vaginal delivery. Blood sugars were well controlled during labor.  Reason for Admission: induction of labor Prenatal Procedures: none Intrapartum Procedures: spontaneous vaginal delivery Postpartum Procedures: none Complications-Operative and Postpartum: none Hemoglobin  Date Value Range Status  12/28/2011 11.1* 12.0 - 15.0 g/dL Final     HCT  Date Value Range Status  12/28/2011 33.4* 36.0 - 46.0 % Final    Physical Exam:  General: alert, cooperative and no distress Lochia: appropriate Uterine Fundus: firm Incision: n/a DVT Evaluation: No evidence of DVT seen on physical exam. Negative Homan's sign. No cords or calf tenderness. No significant calf/ankle edema.  Discharge Diagnoses: Term Pregnancy-delivered  Discharge Information: Date: 12/28/2011 Activity: pelvic rest Diet: routine Medications: PNV and Ibuprofen Condition: stable Instructions: refer to practice specific booklet Discharge to: home Follow-up Information    Follow up with [facility]. (You should receive a call with an appointment.)    Contact information:   [address] [place] [address] [phone]        She will get a 2 hour glucose tolerance test at 6-week post-partum visit. She will get Depo shots for contraception starting at her postpartum visit.  Newborn Data: Live born female  Birth Weight: 7 lb 0.6 oz (3192 g) APGAR: 9, 9  Home with mother.  Napoleon Form 12/28/2011, 9:03 AM

## 2011-12-28 NOTE — Clinical Social Work Maternal (Signed)
    Clinical Social Work Department PSYCHOSOCIAL ASSESSMENT - MATERNAL/CHILD 12/28/2011  Patient:  Jasmine Vasquez, Jasmine Vasquez  Account Number:  [id number]  Admit Date:  12/27/2011  Marjo Bicker Name:   Noah Charon. Cortez-Frazer    Clinical Social Worker:  Nobie Putnam, LCSW   Date/Time:  12/28/2011 11:34 AM  Date Referred:  12/28/2011   Referral source  CN     Referred reason  [facility]   Other referral source:    I:  FAMILY / HOME ENVIRONMENT Child's legal guardian:  PARENT  Guardian - Name Guardian - Age Guardian - Address  Deola Rewis [address] [place], [place] [address]  Spouse     Other household support members/support persons Other support:    II  PSYCHOSOCIAL DATA Information Source:  Patient Interview  Event organiser Employment:   Financial resources:   If Medicaid - County:    School / Grade:   Maternity Care Coordinator / Child Services Coordination / Early Interventions:  Cultural issues impacting care:    III  STRENGTHS Strengths  Adequate Resources  Home prepared for Child (including basic supplies)  Supportive family/friends   Strength comment:    IV  RISK FACTORS AND CURRENT PROBLEMS Current Problem:  YES   Risk Factor & Current Problem Patient Issue Family Issue Risk Factor / Current Problem Comment  Other - See comment Y N LPNC @ 29 weeks    V  SOCIAL WORK ASSESSMENT Pt did not seek PNC prior to 29 weeks because she was unaware of pregnancy.  Once pregnancy was confirmed, she established [facility] and attended appointments regularly.  She denies illegal substance use and verbalized understanding of hospital drug testing policy.  UDS collection & meconium results are pending.  She has all the necessary supplies for the infant.  Pt's spouse at the bedside and appears supportive.  She denies history of depression.  CSW will monitor drug screen results and make a referral if needed.      VI SOCIAL WORK PLAN Social Work Plan  No Further  Intervention Required / No Barriers to Discharge   Type of pt/family education:   If child protective services report - county:   If child protective services report - date:   Information/referral to community resources comment:   Other social work plan:

## 2011-12-29 NOTE — Discharge Summary (Signed)
Attestation of Attending Supervision of Advanced Practitioner (CNM/NP): Evaluation and management procedures were performed by the Advanced Practitioner under my supervision and collaboration.  I have reviewed the Advanced Practitioner's note and chart, and I agree with the management and plan.  HARRAWAY-SMITH, Larell Baney 11:08 AM    

## 2012-01-10 NOTE — Progress Notes (Signed)
NST 12-12 reactive 

## 2012-01-10 NOTE — Progress Notes (Signed)
NST 125 reactive

## 2012-01-19 ENCOUNTER — Encounter: Payer: Self-pay | Admitting: *Deleted

## 2012-02-04 ENCOUNTER — Ambulatory Visit (INDEPENDENT_AMBULATORY_CARE_PROVIDER_SITE_OTHER): Payer: Self-pay | Admitting: Obstetrics & Gynecology

## 2012-02-04 ENCOUNTER — Encounter: Payer: Self-pay | Admitting: Obstetrics & Gynecology

## 2012-02-04 DIAGNOSIS — O99815 Abnormal glucose complicating the puerperium: Secondary | ICD-10-CM

## 2012-02-04 DIAGNOSIS — O24439 Gestational diabetes mellitus in the puerperium, unspecified control: Secondary | ICD-10-CM

## 2012-02-04 NOTE — Progress Notes (Signed)
Patient ID: Jasmine Vasquez, female   DOB: 1970/04/14, 42 y.o.   MRN: [id number] Subjective:     Jasmine Vasquez is a 42 y.o. female who presents for a postpartum visit. She is 5 weeks postpartum following a spontaneous vaginal delivery. I have fully reviewed the prenatal and intrapartum course. The delivery was at term. Outcome: spontaneous vaginal delivery. Postpartum course has been uncomplicated. Baby's course has been uncomplicated. Baby is feeding by both breast and bottle - unknown. Bleeding no bleeding. Bowel function is normal. Bladder function is normal. Patient is not sexually active. Contraception method is abstinence. Postpartum depression screening: negative.  The following portions of the patient's history were reviewed and updated as appropriate: allergies, current medications, past family history, past medical history, past social history, past surgical history and problem list.  Review of Systems Pertinent items are noted in HPI.   Objective:    BP 146/93  Pulse 74  Temp 98 F (36.7 C)  Wt 172 lb 9.6 oz (78.291 kg)  Breastfeeding? Yes  General:  alert and no distress   Breasts:  negative pt leaking (exam deferred)  Lungs: clear to auscultation bilaterally  Heart:  regular rate and rhythm, S1, S2 normal, no murmur, click, rub or gallop  Abdomen: soft, non-tender; bowel sounds normal; no masses,  no organomegaly   Vulva:  normal  Vagina: normal vagina  Cervix:  no cervical motion tenderness and   Corpus: normal size, contour, position, consistency, mobility, non-tender  Adnexa:  no mass, fullness, tenderness  Rectal Exam: Not performed.        Assessment:    5 week postpartum exam. Pap smear not done at today's visit.   Plan:    1. Contraception: abstinence 2. Want Depo Provera but, will get Rx from the health dept 3. Follow up in: 2 days or as needed. for 2hr GTT

## 2012-02-04 NOTE — Patient Instructions (Signed)
Cuidados luego de un parto por vía vaginal  (Postpartum Care After Vaginal Delivery)  Luego del nacimiento del bebé deberá permanecer en el hospital durante 24 a 72 horas, excepto que hubiera existido algún problema, o usted sufra alguna enfermedad. Mientras se encuentre en el hospital recibirá ayuda e instrucciones por parte de las enfermeras y el médico, quienes cuidarán de usted y su bebé y le darán consejos para amamantarlo correctamente, especialmente si es el primer hijo.   En caso de ser necesario, le prescribirán analgésicos. Observará una pequeña hemorragia vaginal y deberá cambiar los apósitos con frecuencia. Lávese las manos cuidadosamente con agua y jabón durante al menos 20 segundos luego de cambiarse el apósito o ir al baño. Si elimina coágulos o aumenta la hemorragia, infórmelo a la enfermera. No deseche los coágulos sanguíneos antes de mostrárselos a la enfermera, para asegurarse de que no es tejido placentario.  Si le han colocado una vía intravenosa, se la retirarán dentro de las 24 horas, si no hay problemas. La primera vez que se levante de la cama o tome una ducha, llame a la enfermera para que la ayude que puede sentirse débil, mareada o desmayarse. Si está amamantando, puede sentir contracciones dolorosas en el útero durante algunas semanas. Esto es normal y necesario, ya que de este modo el útero vuelve a su tamaño normal. Si no está amamantando, utilice un sostén de soporte y trate de no tocarse las mamas hasta que haya dejado de producir leche. No deben administrarse hormonas para suprimir la leche, debido a que pueden causar coágulos sanguíneos. Podrá seguir una dieta normal, excepto que sufra diabetes o presente otros problemas de salud.   La enfermera colocará bolsas con hielo en el sitio de la episiotomía (agrandamiento quirúrgico de la apertura vaginal) para reducir el dolor y la hinchazón. En algunos casos raros hay dificultad para orinar, entonces la enfermera deberá vaciarle la  vejiga con un catéter. Si le han practicado una ligadura tubaria durante el posparto ("trompas atadas", esterilización femenina), esto no hará que permanezca más tiempo en el hospital.  Podrá tener al bebé en su habitación todo el tiempo que lo desee si el bebé no tiene ningún problema. Lleve y traiga al bebé de la nursery dentro de la cunita. No lo lleve en brazos. No abandone el área de posparto. Si la madre es Rh negativa (falta de una proteína en los glóbulos rojos) y el bebé es Rh positivo, la madre debe aplicarse la vacuna RhoGam para evitar problemas con el factor Rh en futuros embarazos  Le darán instrucciones por escrito para usted y el bebé y los medicamentos necesarios cuando reciba el alta médica. Asegúrese que comprende y sigue las indicaciones.  INSTRUCCIONES PARA EL CUIDADO DOMICILIARIO  · Siga las instrucciones y tome los medicamentos que le indicaron cuando le dieron el alta médica.  · Utilice los medicamentos de venta libre o de prescripción para el dolor, el malestar o la fiebre, según se lo indique el profesional que lo asiste.  · No tome aspirina, ya que puede causar hemorragias.  · Aumente sus actividades un poco cada día para tener más fuerza y resistencia.  · No beba alcohol, especialmente si está amamantando o toma analgésicos.  · Tómese la temperatura dos veces por día y regístrela.  · Podrá tener una pequeña hemorragia durante 2 a 4 semanas. Esto es normal.  · No utilice tampones o duchas vaginales, use toallas higiénicas.  · Trate de que alguna persona permanezca con usted y la   ayude durante los primeros días en el hogar.  · Descanse o duerma una siesta cuando el bebé duerma.  · Si está amamantando, use un buen sostén. Si no está amamantando, use un buen sostén y no estimule los pezones.  · Consuma una dieta sana y siga tomando las vitaminas prenatales.  · No conduzca vehículos, no realice actividades pesadas ni viaje hasta que su médico la autorice.  · No mantenga relaciones sexuales  hasta que el médico lo permita.  · Consulte con el profesional cuando puede comenzar a realizar actividad física y que tipo de ejercicios puede hacer.  · Comuníquese inmediatamente con el médico si tiene problemas luego del parto.  · Comuníquese con el pediatra si tiene problemas con el bebé.  · Programe su visita de control luego del parto y cúmplala.  SOLICITE ATENCIÓN MÉDICA SI:  · La temperatura se eleva por encima de 100° F (37.8° C).  · Aumenta la hemorragia vaginal o elimina coágulos. Conserve algunos coágulos para mostrárselos al médico.  · Observa sangre o siente dolor al orinar.  · Presenta secreción vaginal con olor fétido.  · Aumenta el dolor o la inflamación en el sitio de la episiotomía (agrandamiento quirúrgico de la apertura vaginal).  · Sufre una cefalea grave.  · Se siente deprimida.  · La incisión se abre.  · Se siente mareada o sufre un desmayo.  · Aparece una erupción cutánea.  · Tiene una reacción o problemas con su medicamento.  · Siente dolor u observa enrojecimiento e hinchazón en el sitio de la vía intravenosa.  SOLICITE ATENCIÓN MÉDICA DE INMEDIATO SI:  · Siente dolor en el pecho.  · Comienza a sentir falta de aire.  · Se desmaya.  · Siente dolor, con o sin hinchazón e irritación en la pierna.  · Tiene una hemorragia vaginal abundante, con o sin coágulos  · Siente dolor en el estómago.  · Observa una secreción vaginal con mal olor.  ASEGURESE QUE:   · Comprende estas instrucciones.  · Controlará su enfermedad.  · Solicitará ayuda de inmediato si no mejora o empeora.  Document Released: 10/25/2006 Document Revised: 03/22/2011  Exit[employer]® Patient Information ©2013 [employer].

## 2012-02-08 ENCOUNTER — Other Ambulatory Visit: Payer: Self-pay

## 2012-02-08 DIAGNOSIS — O99814 Abnormal glucose complicating childbirth: Secondary | ICD-10-CM

## 2012-02-09 LAB — GLUCOSE TOLERANCE, 2 HOURS W/ 1HR
Glucose, 1 hour: 203 mg/dL — ABNORMAL HIGH (ref 70–170)
Glucose, Fasting: 104 mg/dL — ABNORMAL HIGH (ref 70–99)

## 2012-02-10 ENCOUNTER — Telehealth: Payer: Self-pay | Admitting: *Deleted

## 2012-02-10 NOTE — Telephone Encounter (Signed)
Message copied by Gerome Apley on Thu Feb 10, 2012  5:01 PM ------      Message from: Jasmine Vasquez      Created: Thu Feb 10, 2012  4:13 PM       Patient had abnormal 2hour Glc test.  Dx: diabetes            Please notify her and refer her to primary care for treatment of diabetes.            Thx,      clh-S

## 2012-02-14 NOTE — Telephone Encounter (Signed)
Patient is Spanish speaking and has no insurance.

## 2012-02-18 NOTE — Telephone Encounter (Signed)
Called patient. 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 Notified of abnormal 2 hr glucose test. Notified to go to PCP. States she does not have one. Gave her information about [facility] for discounted primary care services especially for people that does not have insurance.  Patient agrees to call them.

## 2013-11-12 ENCOUNTER — Encounter: Payer: Self-pay | Admitting: Obstetrics & Gynecology

## 2014-01-22 IMAGING — US US OB COMP +14 WK
1 series · 12 of 28 positions shown · non-contrast
Comparison: none

[Series 1: us ob detail +14 wk · 12 of 74 slices shown]
[im 3/74]
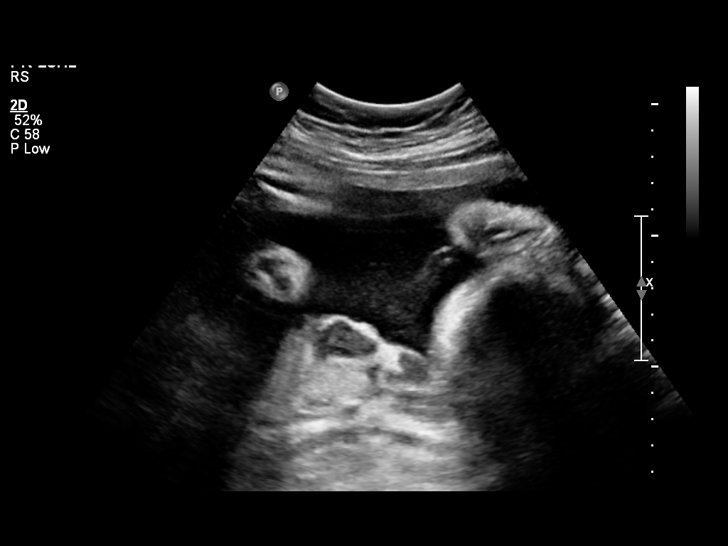
[im 9/74]
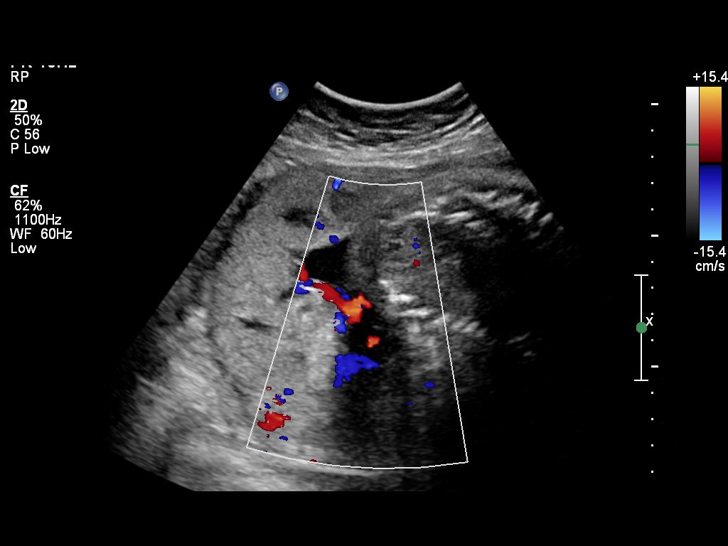
[im 14/74]
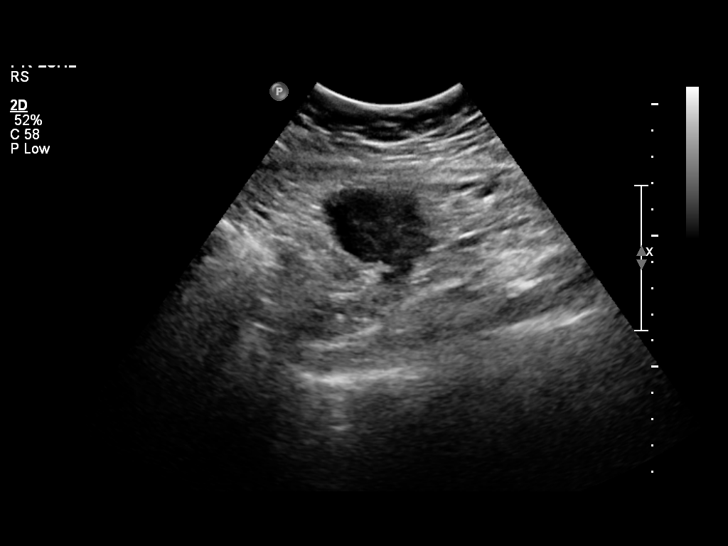
[im 22/74]
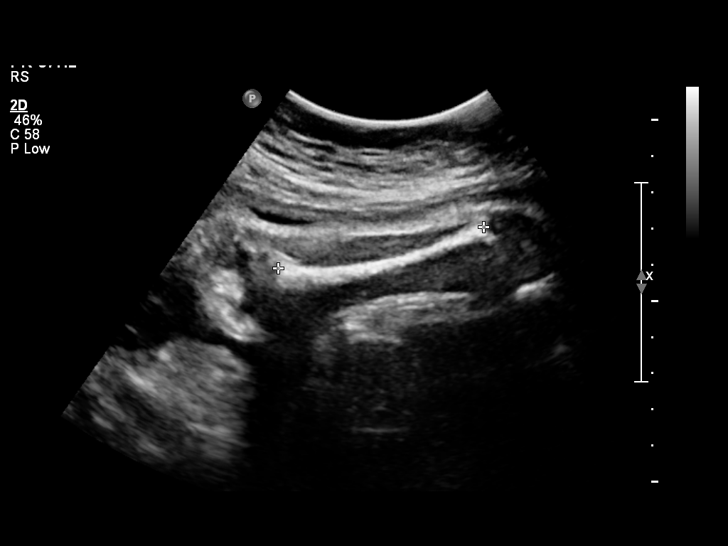
[im 28/74]
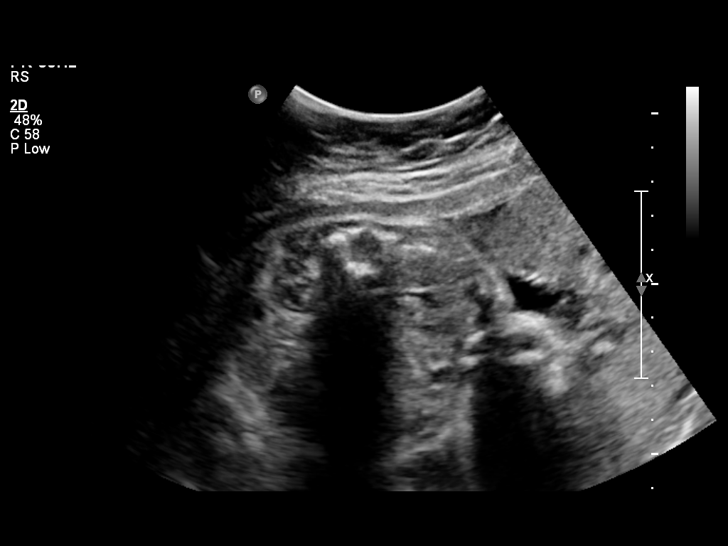
[im 33/74]
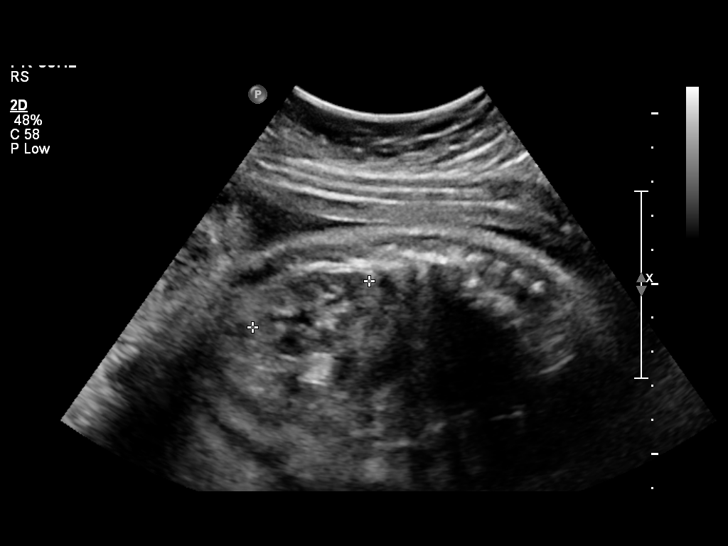
[im 41/74]
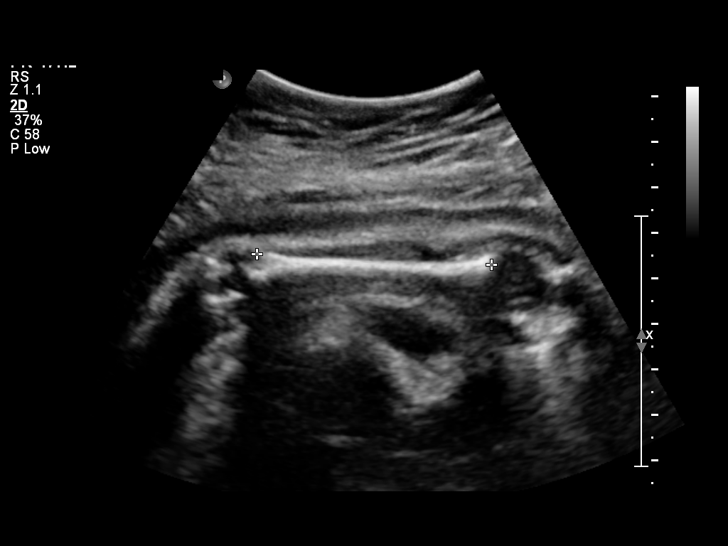
[im 46/74]
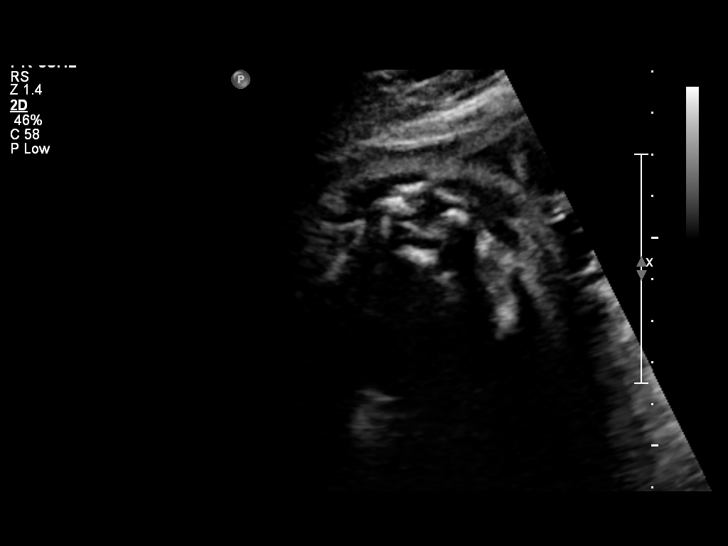
[im 52/74]
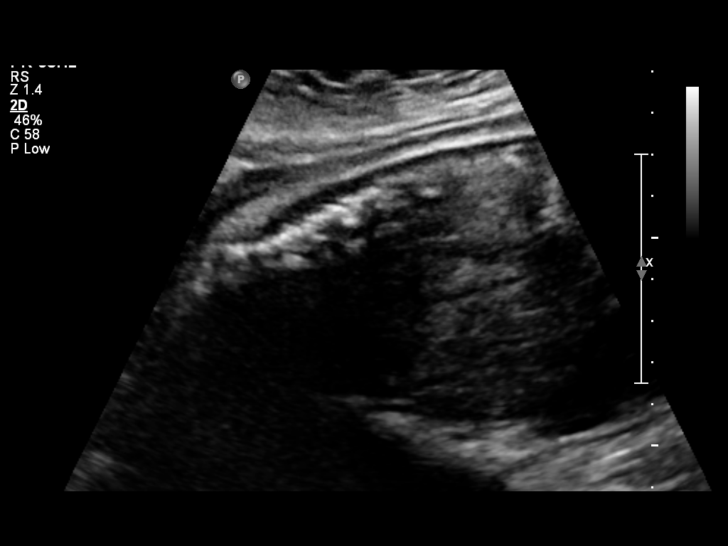
[im 60/74]
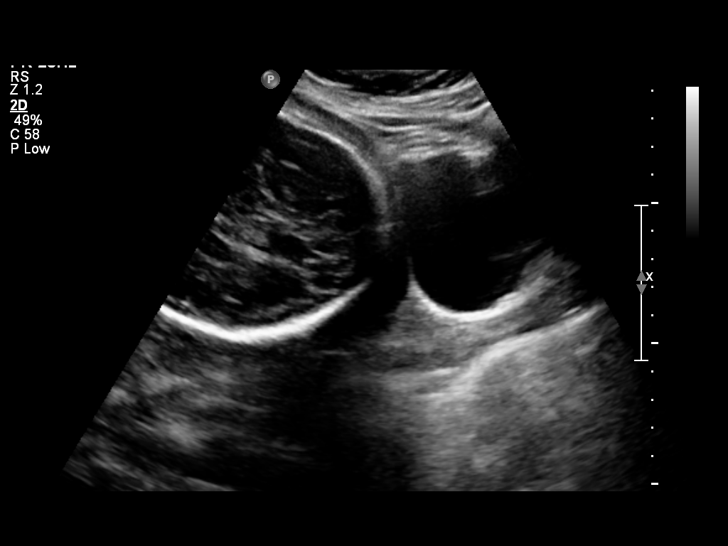
[im 65/74]
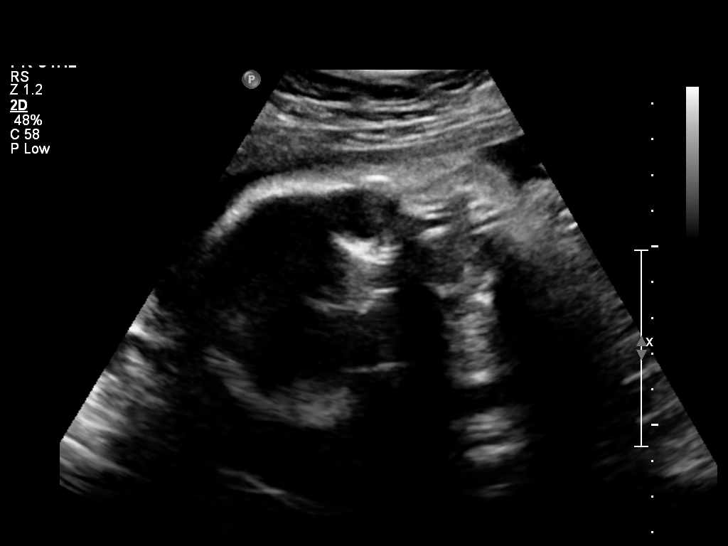
[im 71/74]
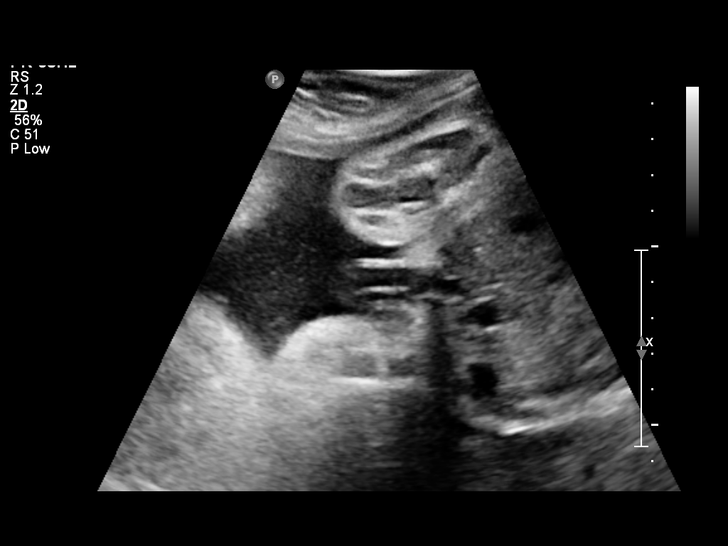

[12 of 28 positions shown; findings below may reference images not displayed]

OBSTETRICS REPORT
                      (Signed Final 10/26/2011 [DATE])

                                                         CNM
Service(s) Provided

 US OB COMP + 14 WK                                    76805.1
Indications

 No or Little Prenatal Care
 Advanced maternal age (AMA), Multigravida
 Poor obstetric history: Previous gestational
 diabetes (Third pregnancy)
 Poor obstetrical history (Hypertension)
Fetal Evaluation

 Num Of Fetuses:    1
 Fetal Heart Rate:  143                         bpm
 Cardiac Activity:  Observed
 Presentation:      Cephalic
 Placenta:          Anterior Fundal, above
                    cervical os
 P. Cord            Visualized
 Insertion:

 Amniotic Fluid
 AFI FV:      Subjectively within normal limits
 AFI Sum:     12.71   cm      36   %Tile     Larg Pckt:     5.4  cm
 RUQ:   3.4    cm    RLQ:    5.4    cm    LUQ:   2.32    cm   LLQ:    1.59   cm
Biometry

 BPD:     80.2  mm    G. Age:   32w 1d                CI:         73.9   70 - 86
                                                      FL/HC:      19.3   19.2 -

 HC:     296.3  mm    G. Age:   32w 5d       85  %    HC/AC:      1.08   0.99 -

 AC:       275  mm    G. Age:   31w 4d       83  %    FL/BPD:     71.4   71 - 87
 FL:      57.3  mm    G. Age:   30w 0d       32  %    FL/AC:      20.8   20 - 24
 HUM:     51.7  mm    G. Age:   30w 1d       54  %

 Est. FW:    5969  gm    3 lb 13 oz      75  %
Gestational Age

 LMP:           30w 1d       Date:   03/29/11                 EDD:   01/03/12
 Clinical EDD:  30w 1d                                        EDD:   01/03/12
 U/S Today:     31w 4d                                        EDD:   12/24/11
 Best:          30w 1d    Det. By:   LMP  (03/29/11)          EDD:   01/03/12
Anatomy

 Cranium:          Appears normal         Aortic Arch:      Not well visualized
 Fetal Cavum:      Appears normal         Ductal Arch:      Not well visualized
 Ventricles:       Appears normal         Diaphragm:        Appears normal
 Choroid Plexus:   Appears normal         Stomach:          Appears normal, left
                                                            sided
 Cerebellum:       Not well visualized    Abdomen:          Appears normal
 Posterior Fossa:  Not well visualized    Abdominal Wall:   Appears nml (cord
                                                            insert, abd wall)
 Nuchal Fold:      Not applicable (>20    Cord Vessels:     Appears normal (3
                   wks GA)                                  vessel cord)
 Face:             Orbits appear          Kidneys:          Appear normal
                   normal
 Lips:             Not well visualized    Bladder:          Appears normal
 Heart:            Appears normal         Spine:            Appears normal
                   (4CH, axis, and
                   situs)
 RVOT:             Not well visualized    Lower             Appears normal
                                          Extremities:
 LVOT:             Not well visualized    Upper             Not well visualized
                                          Extremities:

 Other:  Fetus appears to be a male. Technically difficult due to advanced GA,
         fetal position, and maternal habitus.
Cervix Uterus Adnexa

 Cervical Length:   3.9       cm

 Cervix:       Normal appearance by transabdominal scan. Closed.
 Left Ovary:   Not visualized.
 Right Ovary:  Size(cm) L: 4.5 x W: 1.7 x H: 2.87  Volume(cc):

 Adnexa:     No abnormality visualized.
Impression

 Single living IUP.  US EGA is concordant with LMP.
 Suboptimal visualization of anatomy due to advanced GA,
 however no fetal anomalies seen involving visualized
 anatomy.
 Amniotic fluid within normal limits, with AFI of 12.71 cm.
 Normal cervical length.

 questions or concerns.

## 2014-03-18 IMAGING — US US OB FOLLOW-UP
1 series · 12 of 28 positions shown · non-contrast
Comparison: none

[Series 1: us ob follow up · 34 acquisitions, 12 frames shown]
[im 2/34]
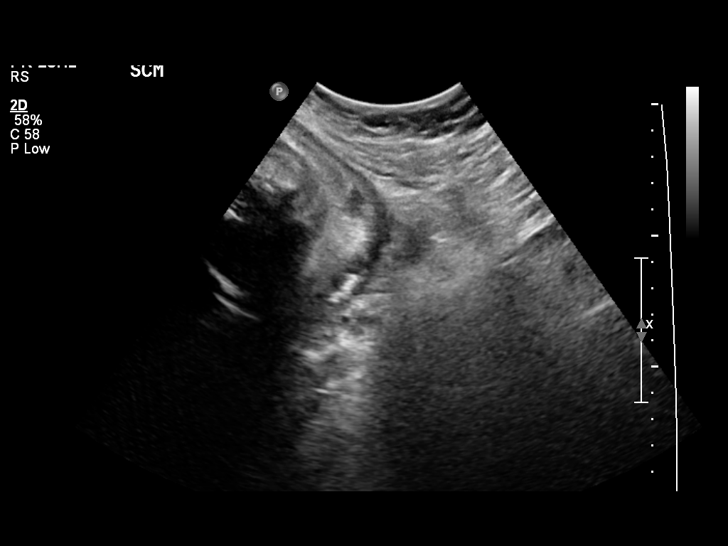
[im 4/34]
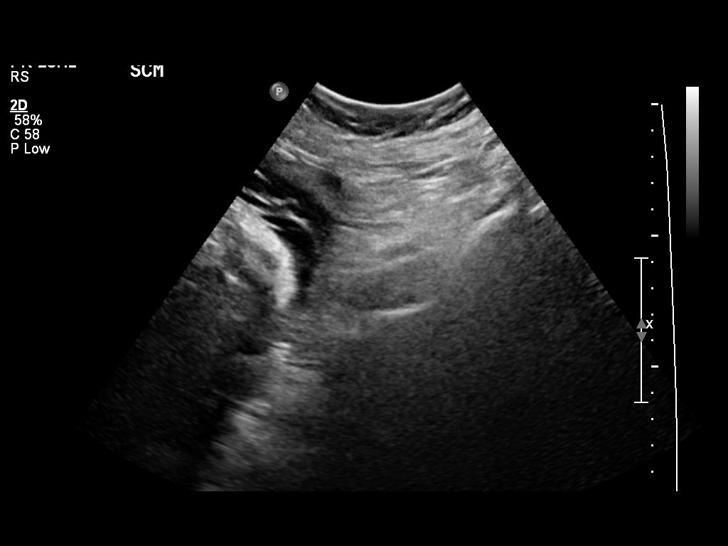
[im 7/34]
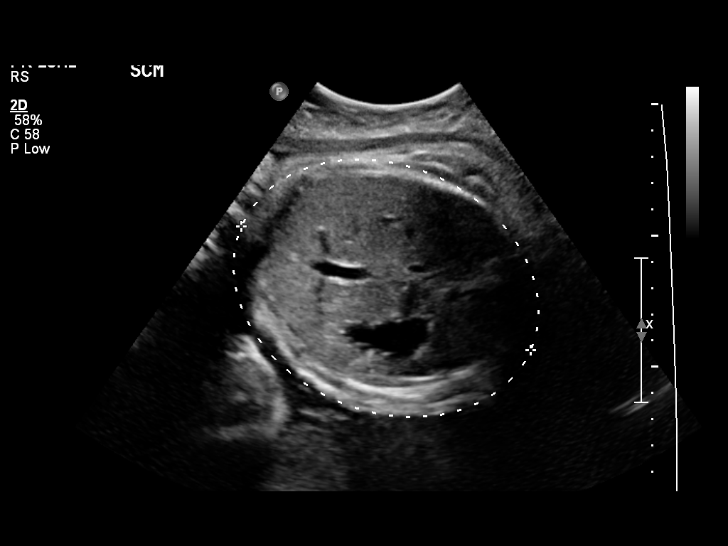
[im 10/34]
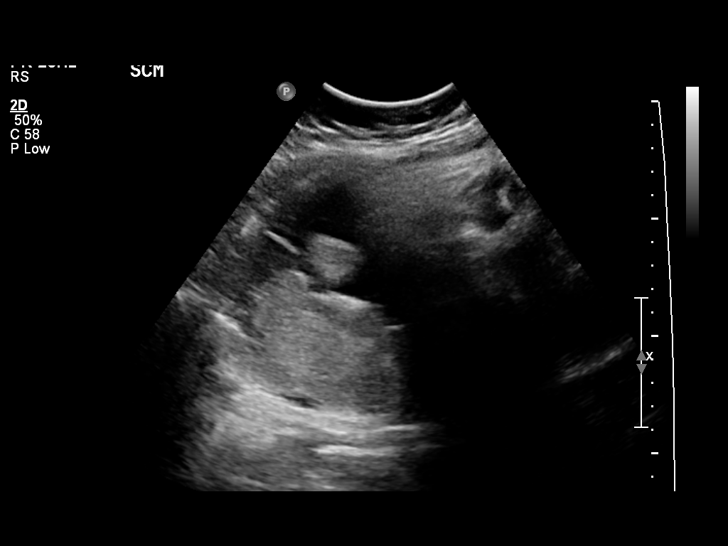
[im 13/34]
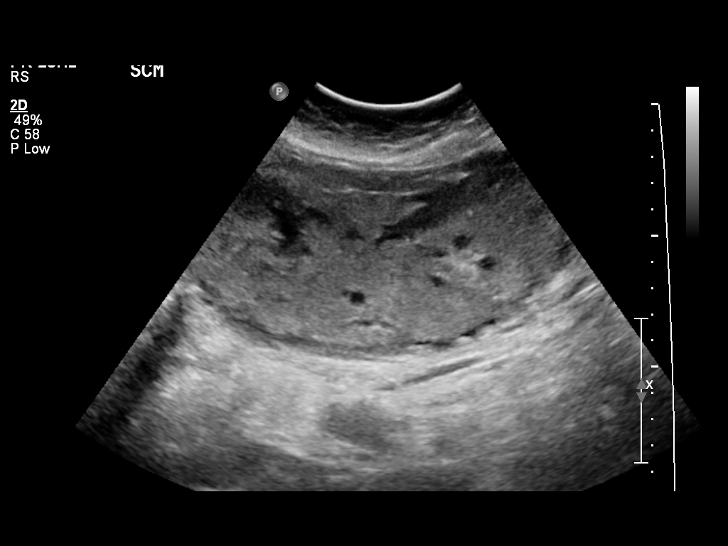
[im 15/34]
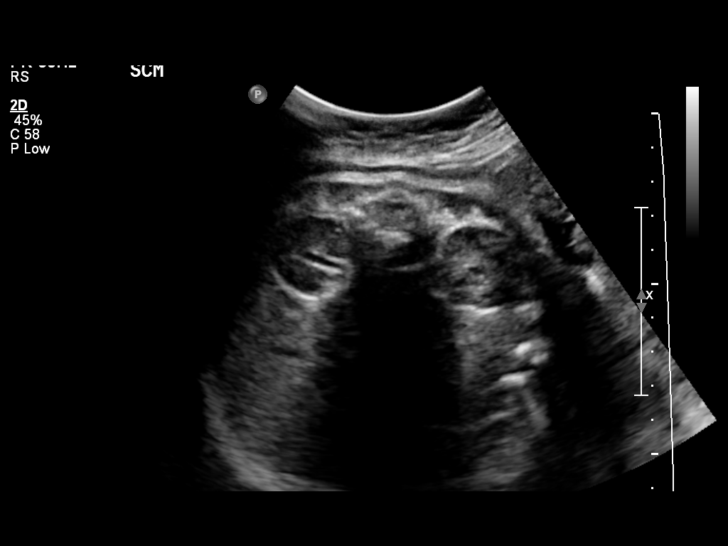
[im 19/34]
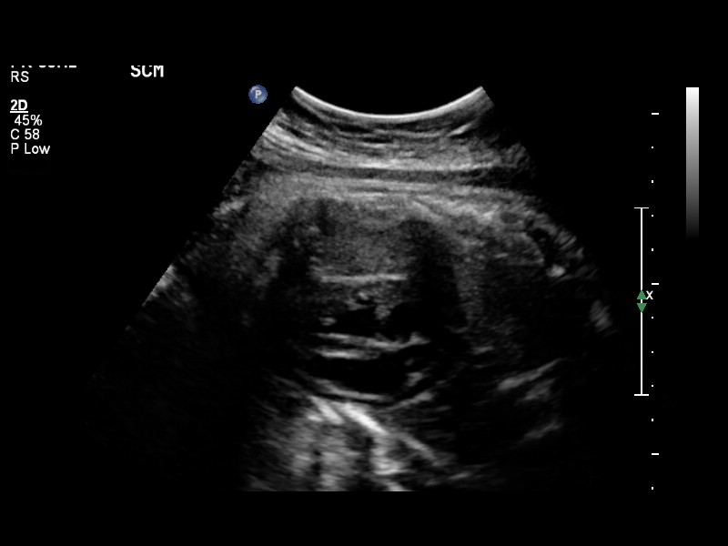
[im 21/34]
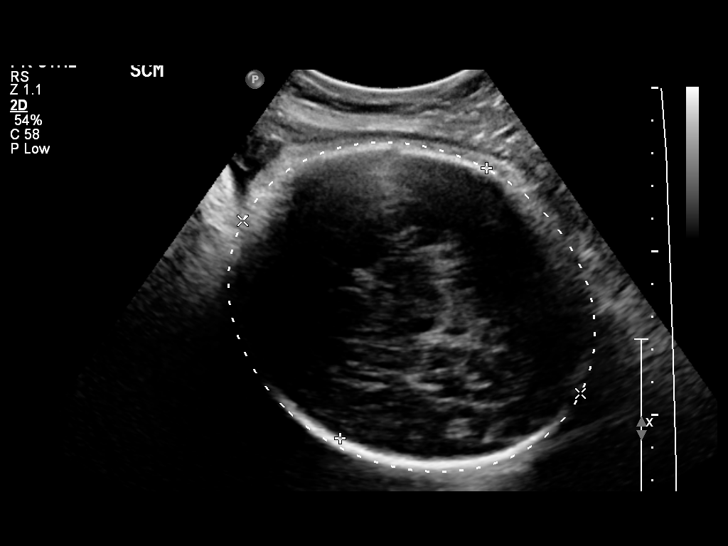
[im 24/34]
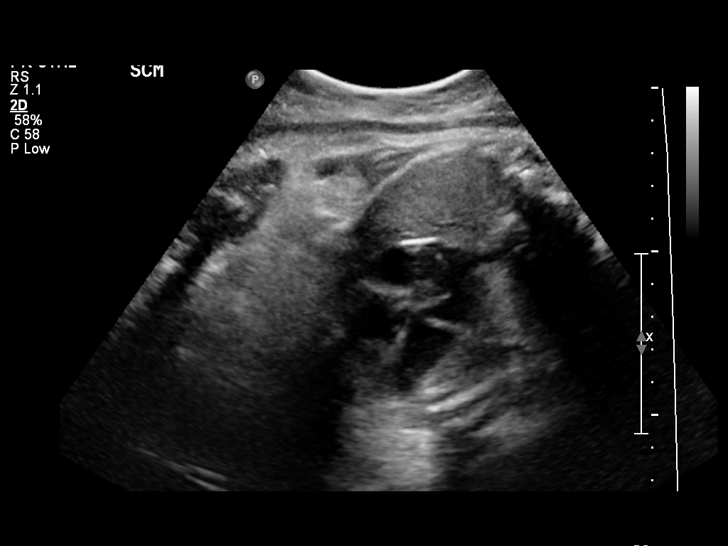
[im 27/34]
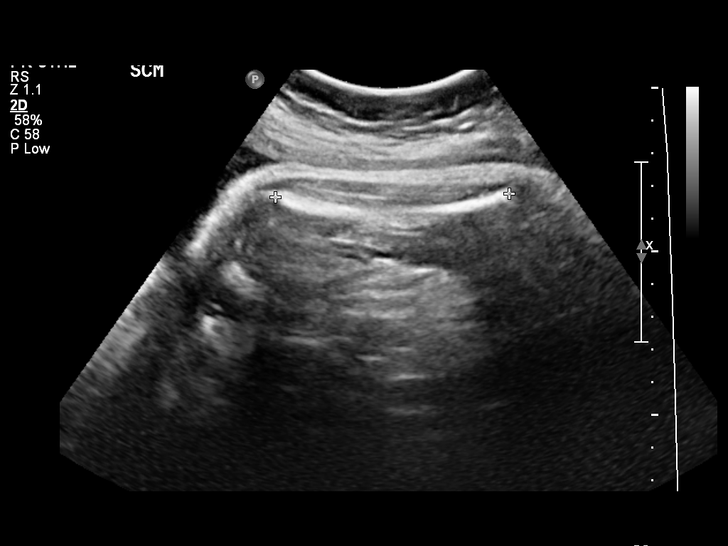
[im 30/34]
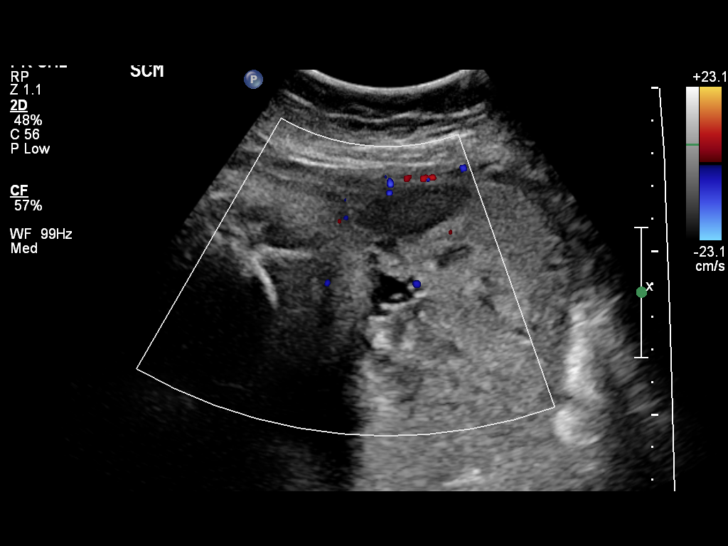
[im 32/34]
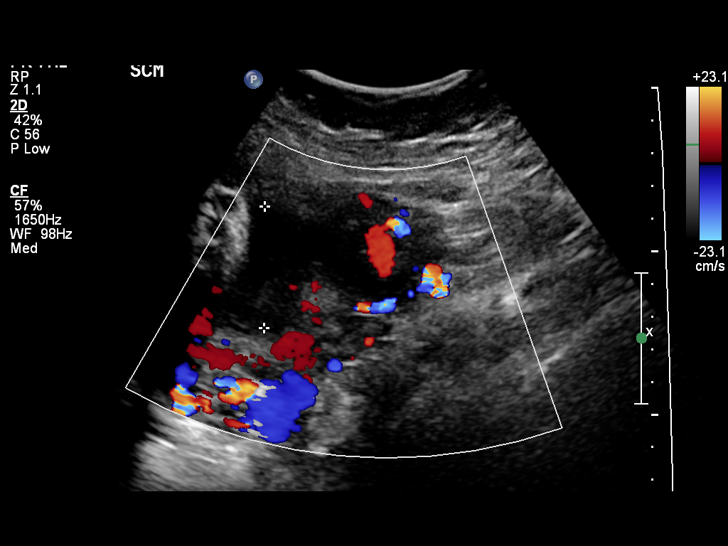

[12 of 28 positions shown; findings below may reference images not displayed]

OBSTETRICS REPORT
                      (Signed Final 12/20/2011 [DATE])

Service(s) Provided

 US OB FOLLOW UP                                       76816.1
Indications

 No or Little Prenatal Care
 Advanced maternal age (AMA), Multigravida
 Poor obstetric history: Previous gestational
 diabetes (Third pregnancy)
 Poor obstetrical history (Hypertension)
 Diabetes - Gestational, A2 (medication controlled)
Fetal Evaluation

 Num Of Fetuses:    1
 Fetal Heart Rate:  142                         bpm
 Cardiac Activity:  Observed
 Presentation:      Cephalic
 Placenta:          Anterior, above cervical os
 P. Cord            Previously Visualized
 Insertion:

 Amniotic Fluid
 AFI FV:      Subjectively within normal limits
 AFI Sum:     14.62   cm      56   %Tile     Larg Pckt:   4.46   cm
 RUQ:   3.21   cm    RLQ:    3.7    cm    LUQ:   3.25    cm   LLQ:    4.46   cm
Biometry

 BPD:     93.5  mm    G. Age:   38w 0d                CI:        78.62   70 - 86
                                                      FL/HC:      21.2   20.9 -

 HC:     333.5  mm    G. Age:   38w 1d       33  %    HC/AC:      0.98   0.92 -

 AC:     338.6  mm    G. Age:   37w 5d       61  %    FL/BPD:     75.7   71 - 87
 FL:      70.8  mm    G. Age:   36w 2d       15  %    FL/AC:      20.9   20 - 24

 Est. FW:    2117  gm      7 lb 2 oz     67  %
Gestational Age

 LMP:           38w 0d       Date:   03/29/11                 EDD:   01/03/12
 Clinical EDD:  38w 0d                                        EDD:   01/03/12
 U/S Today:     37w 4d                                        EDD:   01/06/12
 Best:          38w 0d    Det. By:   LMP  (03/29/11)          EDD:   01/03/12
Anatomy

 Cranium:          Appears normal         Aortic Arch:      Not well visualized
 Fetal Cavum:      Previously seen        Ductal Arch:      Not well visualized
 Ventricles:       Appears normal         Diaphragm:        Appears normal
 Choroid Plexus:   Appears normal         Stomach:          Appears normal, left
                                                            sided
 Cerebellum:       Not well visualized    Abdomen:          Appears normal
 Posterior Fossa:  Not well visualized    Abdominal Wall:   Previously seen
 Nuchal Fold:      Not applicable (>20    Cord Vessels:     Previously seen
                   wks GA)
 Face:             Orbits previously      Kidneys:          Appear normal
                   seen
 Lips:             Not well visualized    Bladder:          Appears normal
 Heart:            Appears normal         Spine:            Previously seen
                   (4CH, axis, and
                   situs)
 RVOT:             Not well visualized    Lower             Previously seen
                                          Extremities:
 LVOT:             Not well visualized    Upper             Not well visualized
                                          Extremities:

 Other:  Male gender previously seen. Technically difficult due to advanced
         GA.
Cervix Uterus Adnexa

 Cervix:       Not visualized (advanced GA >34 wks)

 Adnexa:     No abnormality visualized.
Impression

 Single living intrauterine pregnancy in cephalic presentation.
 The estimated gestational age is 38w 0d based on LMP
 (03/29/11). Estimated fetal weight is 3224g,  67th percentile
 for gestational age of   38w 0d. Amniotic fluid index is
 cm.

 questions or concerns.

## 2018-06-06 ENCOUNTER — Ambulatory Visit (HOSPITAL_COMMUNITY)
Admission: EM | Admit: 2018-06-06 | Discharge: 2018-06-06 | Disposition: A | Discharge status: Home / Self Care | Payer: Self-pay | Attending: Family Medicine | Admitting: Family Medicine

## 2018-06-06 ENCOUNTER — Other Ambulatory Visit: Payer: Self-pay

## 2018-06-06 ENCOUNTER — Encounter (HOSPITAL_COMMUNITY): Payer: Self-pay | Admitting: Emergency Medicine

## 2018-06-06 DIAGNOSIS — T2120XA Burn of second degree of trunk, unspecified site, initial encounter: Secondary | ICD-10-CM

## 2018-06-06 MED ORDER — SILVER SULFADIAZINE 1 % EX CREA: 1.0000 "application " | TOPICAL_CREAM | Freq: Every day | CUTANEOUS | 0 refills | Status: DC

## 2018-06-06 MED ORDER — CEPHALEXIN 500 MG PO CAPS: 500.0000 mg | ORAL_CAPSULE | Freq: Four times a day (QID) | ORAL | 0 refills | Status: AC

## 2018-06-06 NOTE — Discharge Instructions (Signed)
Begin Keflex 4 times a day for the next 5 days to cover for infection  May apply Silvadene cream or continue with bacitracin and zinc cream  May apply aloe  Please follow-up if continuing to not resolve on its own.
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
## 2021-07-24 ENCOUNTER — Other Ambulatory Visit: Payer: Self-pay

## 2021-07-24 DIAGNOSIS — N6315 Unspecified lump in the right breast, overlapping quadrants: Secondary | ICD-10-CM

## 2021-07-24 NOTE — Addendum Note (Signed)
Addended by: Lucilla Lame E on: 07/24/2021 10:07 AM   Modules accepted: Orders

## 2021-12-17 ENCOUNTER — Emergency Department (HOSPITAL_COMMUNITY): Payer: Self-pay

## 2021-12-17 ENCOUNTER — Other Ambulatory Visit: Payer: Self-pay

## 2021-12-17 ENCOUNTER — Inpatient Hospital Stay (HOSPITAL_COMMUNITY)
Admission: EM | Admit: 2021-12-17 | Discharge: 2021-12-20 | DRG: 419 | Disposition: A | Discharge status: Home / Self Care | Payer: Self-pay | Attending: General Surgery | Admitting: General Surgery

## 2021-12-17 DIAGNOSIS — K810 Acute cholecystitis: Principal | ICD-10-CM | POA: Diagnosis present

## 2021-12-17 DIAGNOSIS — Z880 Allergy status to penicillin: Secondary | ICD-10-CM

## 2021-12-17 DIAGNOSIS — I10 Essential (primary) hypertension: Secondary | ICD-10-CM | POA: Diagnosis present

## 2021-12-17 DIAGNOSIS — K8020 Calculus of gallbladder without cholecystitis without obstruction: Secondary | ICD-10-CM

## 2021-12-17 DIAGNOSIS — Z8249 Family history of ischemic heart disease and other diseases of the circulatory system: Secondary | ICD-10-CM

## 2021-12-17 DIAGNOSIS — R112 Nausea with vomiting, unspecified: Principal | ICD-10-CM

## 2021-12-17 DIAGNOSIS — R1084 Generalized abdominal pain: Secondary | ICD-10-CM

## 2021-12-17 DIAGNOSIS — Z7984 Long term (current) use of oral hypoglycemic drugs: Secondary | ICD-10-CM

## 2021-12-17 DIAGNOSIS — E119 Type 2 diabetes mellitus without complications: Secondary | ICD-10-CM | POA: Diagnosis present

## 2021-12-17 DIAGNOSIS — E876 Hypokalemia: Secondary | ICD-10-CM | POA: Diagnosis present

## 2021-12-17 DIAGNOSIS — Z79899 Other long term (current) drug therapy: Secondary | ICD-10-CM

## 2021-12-17 DIAGNOSIS — E860 Dehydration: Secondary | ICD-10-CM

## 2021-12-17 DIAGNOSIS — K82A1 Gangrene of gallbladder in cholecystitis: Secondary | ICD-10-CM | POA: Diagnosis present

## 2021-12-17 LAB — URINALYSIS, ROUTINE W REFLEX MICROSCOPIC
Bilirubin Urine: NEGATIVE
Glucose, UA: >=500 mg/dL — AB
Ketones, ur: 80 mg/dL — AB
Leukocytes,Ua: NEGATIVE
Nitrite: NEGATIVE
Protein, ur: 100 mg/dL — AB
Specific Gravity, Urine: 1.028 (ref 1.005–1.030)
pH: 5 (ref 5.0–8.0)

## 2021-12-17 LAB — COMPREHENSIVE METABOLIC PANEL
ALT: 19 U/L (ref 0–44)
AST: 15 U/L (ref 15–41)
Albumin: 3.7 g/dL (ref 3.5–5.0)
Alkaline Phosphatase: 98 U/L (ref 38–126)
Anion gap: 12 (ref 5–15)
BUN: 7 mg/dL (ref 6–20)
CO2: 16 mmol/L — ABNORMAL LOW (ref 22–32)
Calcium: 9.2 mg/dL (ref 8.9–10.3)
Chloride: 105 mmol/L (ref 98–111)
Creatinine, Ser: 0.63 mg/dL (ref 0.44–1.00)
GFR, Estimated: >60 mL/min (ref >60)
Glucose, Bld: 206 mg/dL — ABNORMAL HIGH (ref 70–99)
Potassium: 3.2 mmol/L — ABNORMAL LOW (ref 3.5–5.1)
Sodium: 133 mmol/L — ABNORMAL LOW (ref 135–145)
Total Bilirubin: 2.1 mg/dL — ABNORMAL HIGH (ref 0.3–1.2)
Total Protein: 8.2 g/dL — ABNORMAL HIGH (ref 6.5–8.1)

## 2021-12-17 LAB — I-STAT BETA HCG BLOOD, ED (MC, WL, AP ONLY): I-stat hCG, quantitative: <5 m[IU]/mL (ref <5)

## 2021-12-17 LAB — HIV ANTIBODY (ROUTINE TESTING W REFLEX): HIV Screen 4th Generation wRfx: NONREACTIVE

## 2021-12-17 LAB — CBG MONITORING, ED: Glucose-Capillary: 182 mg/dL — ABNORMAL HIGH (ref 70–99)

## 2021-12-17 LAB — CBC
HCT: 48.3 % — ABNORMAL HIGH (ref 36.0–46.0)
Hemoglobin: 15.4 g/dL — ABNORMAL HIGH (ref 12.0–15.0)
MCH: 30.9 pg (ref 26.0–34.0)
MCHC: 31.9 g/dL (ref 30.0–36.0)
MCV: 96.8 fL (ref 80.0–100.0)
Platelets: 181 10*3/uL (ref 150–400)
RBC: 4.99 MIL/uL (ref 3.87–5.11)
RDW: 12.7 % (ref 11.5–15.5)
WBC: 19.7 10*3/uL — ABNORMAL HIGH (ref 4.0–10.5)
nRBC: 0 % (ref 0.0–0.2)

## 2021-12-17 LAB — MAGNESIUM: Magnesium: 1.5 mg/dL — ABNORMAL LOW (ref 1.7–2.4)

## 2021-12-17 LAB — LIPASE, BLOOD: Lipase: 28 U/L (ref 11–51)

## 2021-12-17 MED ORDER — METRONIDAZOLE 500 MG/100ML IV SOLN
500.0000 mg | Freq: Once | INTRAVENOUS | Status: AC
  Administered 2021-12-17: 500 mg via INTRAVENOUS
  Filled 2021-12-17: qty 100

## 2021-12-17 MED ORDER — ATENOLOL 25 MG PO TABS
50.0000 mg | ORAL_TABLET | Freq: Every day | ORAL | Status: DC
  Administered 2021-12-17 – 2021-12-20 (×4): 50 mg via ORAL
  Filled 2021-12-17: qty 2
  Filled 2021-12-17: qty 1
  Filled 2021-12-17 (×2): qty 2

## 2021-12-17 MED ORDER — SODIUM CHLORIDE 0.9 % IV BOLUS
1000.0000 mL | Freq: Once | INTRAVENOUS | Status: AC
  Administered 2021-12-17: 1000 mL via INTRAVENOUS

## 2021-12-17 MED ORDER — HYDROMORPHONE HCL 1 MG/ML IJ SOLN
0.5000 mg | Freq: Once | INTRAMUSCULAR | Status: AC
  Administered 2021-12-17: 0.5 mg via INTRAVENOUS
  Filled 2021-12-17: qty 1

## 2021-12-17 MED ORDER — ACETAMINOPHEN 650 MG RE SUPP: 650.0000 mg | Freq: Four times a day (QID) | RECTAL | Status: DC | PRN

## 2021-12-17 MED ORDER — PANTOPRAZOLE SODIUM 40 MG IV SOLR
40.0000 mg | Freq: Every day | INTRAVENOUS | Status: DC
  Administered 2021-12-17 – 2021-12-19 (×3): 40 mg via INTRAVENOUS
  Filled 2021-12-17 (×3): qty 10

## 2021-12-17 MED ORDER — SODIUM CHLORIDE 0.9 % IV SOLN
2.0000 g | Freq: Once | INTRAVENOUS | Status: AC
  Administered 2021-12-17: 2 g via INTRAVENOUS
  Filled 2021-12-17: qty 12.5

## 2021-12-17 MED ORDER — ONDANSETRON HCL 4 MG/2ML IJ SOLN
4.0000 mg | Freq: Once | INTRAMUSCULAR | Status: DC
  Filled 2021-12-17: qty 2

## 2021-12-17 MED ORDER — ONDANSETRON HCL 4 MG/2ML IJ SOLN: 4.0000 mg | Freq: Four times a day (QID) | INTRAMUSCULAR | Status: DC | PRN

## 2021-12-17 MED ORDER — ONDANSETRON HCL 4 MG/2ML IJ SOLN
4.0000 mg | Freq: Once | INTRAMUSCULAR | Status: AC
  Administered 2021-12-17: 4 mg via INTRAVENOUS

## 2021-12-17 MED ORDER — DIPHENHYDRAMINE HCL 50 MG/ML IJ SOLN: 25.0000 mg | Freq: Four times a day (QID) | INTRAMUSCULAR | Status: DC | PRN

## 2021-12-17 MED ORDER — HYDROMORPHONE HCL 1 MG/ML IJ SOLN
1.0000 mg | INTRAMUSCULAR | Status: DC | PRN
  Administered 2021-12-17 – 2021-12-18 (×2): 1 mg via INTRAVENOUS
  Filled 2021-12-17 (×2): qty 1

## 2021-12-17 MED ORDER — ONDANSETRON 4 MG PO TBDP
4.0000 mg | ORAL_TABLET | Freq: Four times a day (QID) | ORAL | Status: DC | PRN
  Administered 2021-12-17 – 2021-12-18 (×2): 4 mg via ORAL
  Filled 2021-12-17 (×2): qty 1

## 2021-12-17 MED ORDER — METHOCARBAMOL 1000 MG/10ML IJ SOLN
500.0000 mg | Freq: Three times a day (TID) | INTRAVENOUS | Status: DC | PRN
  Filled 2021-12-17: qty 5

## 2021-12-17 MED ORDER — AMLODIPINE BESYLATE 5 MG PO TABS
5.0000 mg | ORAL_TABLET | Freq: Every day | ORAL | Status: DC
  Administered 2021-12-17 – 2021-12-20 (×3): 5 mg via ORAL
  Filled 2021-12-17 (×3): qty 1

## 2021-12-17 MED ORDER — PROCHLORPERAZINE MALEATE 10 MG PO TABS: 10.0000 mg | ORAL_TABLET | Freq: Four times a day (QID) | ORAL | Status: DC | PRN

## 2021-12-17 MED ORDER — SODIUM CHLORIDE 0.9 % IV SOLN: INTRAVENOUS | Status: DC

## 2021-12-17 MED ORDER — IOHEXOL 350 MG/ML SOLN
75.0000 mL | Freq: Once | INTRAVENOUS | Status: AC | PRN
  Administered 2021-12-17: 75 mL via INTRAVENOUS

## 2021-12-17 MED ORDER — METHOCARBAMOL 500 MG PO TABS
500.0000 mg | ORAL_TABLET | Freq: Three times a day (TID) | ORAL | Status: DC | PRN
  Administered 2021-12-19 – 2021-12-20 (×4): 500 mg via ORAL
  Filled 2021-12-17 (×4): qty 1

## 2021-12-17 MED ORDER — POLYETHYLENE GLYCOL 3350 17 G PO PACK: 17.0000 g | PACK | Freq: Every day | ORAL | Status: DC | PRN

## 2021-12-17 MED ORDER — DIPHENHYDRAMINE HCL 25 MG PO CAPS: 25.0000 mg | ORAL_CAPSULE | Freq: Four times a day (QID) | ORAL | Status: DC | PRN

## 2021-12-17 MED ORDER — OXYCODONE HCL 5 MG PO TABS
5.0000 mg | ORAL_TABLET | ORAL | Status: DC | PRN
  Administered 2021-12-17: 10 mg via ORAL
  Administered 2021-12-18: 5 mg via ORAL
  Filled 2021-12-17: qty 2
  Filled 2021-12-17: qty 1

## 2021-12-17 MED ORDER — INSULIN ASPART 100 UNIT/ML IJ SOLN
0.0000 [IU] | Freq: Every day | INTRAMUSCULAR | Status: DC
  Administered 2021-12-17 – 2021-12-19 (×2): 2 [IU] via SUBCUTANEOUS

## 2021-12-17 MED ORDER — SIMETHICONE 80 MG PO CHEW: 40.0000 mg | CHEWABLE_TABLET | Freq: Four times a day (QID) | ORAL | Status: DC | PRN

## 2021-12-17 MED ORDER — DOCUSATE SODIUM 100 MG PO CAPS
100.0000 mg | ORAL_CAPSULE | Freq: Two times a day (BID) | ORAL | Status: DC
  Administered 2021-12-17 – 2021-12-20 (×5): 100 mg via ORAL
  Filled 2021-12-17 (×5): qty 1

## 2021-12-17 MED ORDER — HYDRALAZINE HCL 20 MG/ML IJ SOLN: 10.0000 mg | INTRAMUSCULAR | Status: DC | PRN

## 2021-12-17 MED ORDER — ACETAMINOPHEN 325 MG PO TABS
650.0000 mg | ORAL_TABLET | Freq: Four times a day (QID) | ORAL | Status: DC | PRN
  Filled 2021-12-17: qty 2

## 2021-12-17 MED ORDER — ENOXAPARIN SODIUM 40 MG/0.4ML IJ SOSY
40.0000 mg | PREFILLED_SYRINGE | Freq: Every day | INTRAMUSCULAR | Status: DC
  Administered 2021-12-18 – 2021-12-20 (×3): 40 mg via SUBCUTANEOUS
  Filled 2021-12-17 (×4): qty 0.4

## 2021-12-17 MED ORDER — LACTATED RINGERS IV BOLUS
1000.0000 mL | Freq: Once | INTRAVENOUS | Status: AC
  Administered 2021-12-17: 1000 mL via INTRAVENOUS

## 2021-12-17 MED ORDER — POTASSIUM CHLORIDE 10 MEQ/100ML IV SOLN
10.0000 meq | INTRAVENOUS | Status: AC
  Administered 2021-12-17 (×2): 10 meq via INTRAVENOUS
  Filled 2021-12-17 (×2): qty 100

## 2021-12-17 MED ORDER — INSULIN ASPART 100 UNIT/ML IJ SOLN
0.0000 [IU] | Freq: Three times a day (TID) | INTRAMUSCULAR | Status: DC
  Administered 2021-12-17 – 2021-12-18 (×4): 3 [IU] via SUBCUTANEOUS
  Administered 2021-12-19: 5 [IU] via SUBCUTANEOUS
  Administered 2021-12-19 (×2): 3 [IU] via SUBCUTANEOUS
  Administered 2021-12-20: 5 [IU] via SUBCUTANEOUS

## 2021-12-17 MED ORDER — PROCHLORPERAZINE EDISYLATE 10 MG/2ML IJ SOLN: 5.0000 mg | Freq: Four times a day (QID) | INTRAMUSCULAR | Status: DC | PRN

## 2021-12-17 NOTE — ED Triage Notes (Signed)
Pt/translator stated, Ive had stomach pain with N/V  for 3 days. No diarrhea.

## 2021-12-17 NOTE — H&P (Signed)
[facility] Admission Note  Jasmine Vasquez [birth year]  [id number].    Requesting MD: Cathren Laine Chief Complaint/Reason for Consult: cholecystitis  HPI:  Jasmine Vasquez is a 51 y.o. female PMH DM who presented to [facility] today with worsening abdominal pain. States that she has had intermittent RUQ abdominal pain for several months that is typically self relenting. This episode began about 3 days ago. Associated with nausea and vomiting. No fevers but she has had chills. In the ED she has been hemodynamically stable. Lab work pertinent for WBC 19.7, LFTs normal except Tbili 2.1. CT a/p shows acute cholecystitis with an obstructing gallstone in the cystic duct.  She was started on IV maxipime and flagyl. General surgery asked to see.  Abdominal surgical history: none Anticoagulants: none Nonsmoker Denies alcohol or illicit drug use Employment: not currently working    Family History  Problem Relation Age of Onset   Hypertension Father    Hypertension Brother     Past Medical History:  Diagnosis Date   Gestational diabetes    third pregnancy   Hypertension     No past surgical history on file.  Social History:  reports that she has never smoked. She has never used smokeless tobacco. She reports that she does not drink alcohol and does not use drugs.  Allergies:  Allergies  Allergen Reactions   Penicillins Other (See Comments)    Dizziness    (Not in a hospital admission)   Prior to Admission medications   Medication Sig Start Date End Date Taking? Authorizing Provider  AMLODIPINE BESYLATE PO Take 5 mg by mouth.    [provider]  atenolol (TENORMIN) 50 MG tablet Take 50 mg by mouth daily.    [provider]  bacitracin 500 UNIT/GM ointment Apply 1 application topically 2 (two) times daily.    [provider]  glipiZIDE (GLUCOTROL) 5 MG tablet Take by mouth daily before breakfast.    [provider]  ibuprofen  (ADVIL,MOTRIN) 600 MG tablet Take 1 tablet (600 mg total) by mouth every 6 (six) hours. 12/28/11   Napoleon Form, MD  lovastatin (MEVACOR) 20 MG tablet Take 20 mg by mouth at bedtime.    [provider]  metFORMIN (GLUCOPHAGE) 1000 MG tablet Take 1,000 mg by mouth 2 (two) times daily with a meal.    [provider]  Prenatal Vit-Fe Fumarate-FA (PRENATAL MULTIVITAMIN) TABS Take 1 tablet by mouth every morning.    [provider]  silver sulfADIAZINE (SILVADENE) 1 % cream Apply 1 application topically daily. 06/06/18   Wieters, Hallie C, PA-C    Blood pressure 126/88, pulse 88, temperature 98.2 F (36.8 C), temperature source Oral, resp. rate 16, SpO2 99 %, currently breastfeeding. Physical Exam: General: pleasant, WD/WN female who is laying in bed in NAD HEENT: head is normocephalic, atraumatic.  Sclera are noninjected.  Pupils equal and round.  Ears and nose without any masses or lesions.  Mouth is pink and moist. Dentition fair Heart: regular, rate, and rhythm Lungs: CTAB, no wheezes, rhonchi, or rales noted.  Respiratory effort nonlabored on room air Abd: soft, ND, +BS, no masses, hernias, or organomegaly. Mild RLQ TTP and moderate RUQ/epigastric TTP with voluntary guarding MS: no BUE/BLE edema, calves soft and nontender Skin: warm and dry with no masses, lesions, or rashes Psych: A&Ox4 with an appropriate affect Neuro: MAEs, no gross motor or sensory deficits BUE/BLE  Results for orders placed or performed during the hospital encounter of 12/17/21 (from  the past 48 hour(s))  CBC     Status: Abnormal   Collection Time: 12/17/21  8:03 AM  Result Value Ref Range   WBC 19.7 (H) 4.0 - 10.5 K/uL   RBC 4.99 3.87 - 5.11 MIL/uL   Hemoglobin 15.4 (H) 12.0 - 15.0 g/dL   HCT 13.0 (H) 86.5 - 78.4 %   MCV 96.8 80.0 - 100.0 fL   MCH 30.9 26.0 - 34.0 pg   MCHC 31.9 30.0 - 36.0 g/dL   RDW 69.6 29.5 - 28.4 %   Platelets 181 150 - 400 K/uL   nRBC 0.0 0.0 - 0.2 %     Comment: Performed at [facility], [address], [place], [place] [address]  Comprehensive metabolic panel     Status: Abnormal   Collection Time: 12/17/21  9:15 AM  Result Value Ref Range   Sodium 133 (L) 135 - 145 mmol/L   Potassium 3.2 (L) 3.5 - 5.1 mmol/L   Chloride 105 98 - 111 mmol/L   CO2 16 (L) 22 - 32 mmol/L   Glucose, Bld 206 (H) 70 - 99 mg/dL    Comment: Glucose reference range applies only to samples taken after fasting for at least 8 hours.   BUN 7 6 - 20 mg/dL   Creatinine, Ser 0.10 0.44 - 1.00 mg/dL   Calcium 9.2 8.9 - 27.2 mg/dL   Total Protein 8.2 (H) 6.5 - 8.1 g/dL   Albumin 3.7 3.5 - 5.0 g/dL   AST 15 15 - 41 U/L   ALT 19 0 - 44 U/L   Alkaline Phosphatase 98 38 - 126 U/L   Total Bilirubin 2.1 (H) 0.3 - 1.2 mg/dL   GFR, Estimated >53 >66 mL/min    Comment: (NOTE) Calculated using the CKD-EPI Creatinine Equation (2021)    Anion gap 12 5 - 15    Comment: Performed at [facility], [address], [place], [place] [address]  Lipase, blood     Status: None   Collection Time: 12/17/21  9:15 AM  Result Value Ref Range   Lipase 28 11 - 51 U/L    Comment: Performed at [facility], [address], [place], [place] [address]  I-Stat beta hCG blood, ED     Status: None   Collection Time: 12/17/21  9:19 AM  Result Value Ref Range   I-stat hCG, quantitative <5.0 <5 mIU/mL   Comment 3            Comment:   GEST. AGE      CONC.  (mIU/mL)   <=1 WEEK        5 - 50     2 WEEKS       50 - 500     3 WEEKS       100 - 10,000     4 WEEKS     1,000 - 30,000        FEMALE AND NON-PREGNANT FEMALE:     LESS THAN 5 mIU/mL   Urinalysis, Routine w reflex microscopic     Status: Abnormal   Collection Time: 12/17/21 10:56 AM  Result Value Ref Range   Color, Urine YELLOW YELLOW   APPearance CLOUDY (A) CLEAR   Specific Gravity, Urine 1.028 1.005 - 1.030   pH 5.0 5.0 - 8.0   Glucose, UA >=500 (A) NEGATIVE mg/dL   Hgb urine dipstick SMALL (A) NEGATIVE    Bilirubin Urine NEGATIVE NEGATIVE   Ketones, ur  80 (A) NEGATIVE mg/dL   Protein, ur 222 (A) NEGATIVE mg/dL   Nitrite NEGATIVE NEGATIVE   Leukocytes,Ua NEGATIVE NEGATIVE   RBC / HPF 6-10 0 - 5 RBC/hpf   WBC, UA 0-5 0 - 5 WBC/hpf   Bacteria, UA MANY (A) NONE SEEN   Squamous Epithelial / LPF 6-10 0 - 5   Mucus PRESENT     Comment: Performed at [facility], [address], [place], [place] [address]   CT Abdomen Pelvis W Contrast  Result Date: 12/17/2021 CLINICAL DATA:  Abdominal pain. EXAM: CT ABDOMEN AND PELVIS WITH CONTRAST TECHNIQUE: Multidetector CT imaging of the abdomen and pelvis was performed using the standard protocol following bolus administration of intravenous contrast. RADIATION DOSE REDUCTION: This exam was performed according to the departmental dose-optimization program which includes automated exposure control, adjustment of the mA and/or kV according to patient size and/or use of iterative reconstruction technique. CONTRAST:  75mL OMNIPAQUE IOHEXOL 350 MG/ML SOLN COMPARISON:  10/04/2010 CT AP FINDINGS: Lower chest: Bibasilar atelectasis. Hepatobiliary: Hepatic steatosis. Liver has a normal contour. No focal liver lesions are visualized. No evidence of intra or extrahepatic biliary ductal dilatation. Portal veins are contrast opacified. There is a small amount of perihepatic fluid that tracks inferiorly into the right pericolic gutter. The gallbladder is markedly distended gallbladder wall thickening and pericholecystic fat stranding. There are a few layering gallstones. There is likely an obstructing gallstone in the cystic duct (series 6, image 40). Pancreas: There is no evidence of peripancreatic fat stranding. Pancreatic duct is normal in size. No focal pancreatic lesion is visualized. Spleen: Normal in size without focal abnormality. Adrenals/Urinary Tract: Bilateral adrenal glands are normal in size possible hyperenhancement. Bilateral kidneys enhance homogeneously and  symmetrically. There is no evidence of hydronephrosis or nephrolithiasis. No focal renal lesions are visualized. The urinary bladder is fluid-filled without focal wall thickening. There is no evidence of Peri cystic fat stranding. There is trace free fluid in the pelvis. Stomach/Bowel: The stomach, small bowel, large bowel are normal in caliber. There is no evidence of bowel obstruction. Fat stranding in the right upper quadrant of the hepatic flexure is favored to be reactive. Appendix is normal in appearance. Vascular/Lymphatic: No significant vascular findings are present. No enlarged abdominal or pelvic lymph nodes. Reproductive: Uterus and bilateral adnexa are unremarkable. Other: No abdominal wall hernia or abnormality. No abdominopelvic ascites. Musculoskeletal: No acute or significant osseous findings. IMPRESSION: 1. Findings compatible with acute cholecystitis with an obstructing gallstone in the cystic duct. 2. Small amount of perihepatic fluid tracking inferiorly into the right pericolic gutter, likely reactive 3. Hyperenhanacement of the bilateral adrenal glands is nonspecific, but could be seen in the setting of hypotension. Electronically Signed   By: Lorenza Cambridge M.D.   On: 12/17/2021 13:25      Assessment/Plan Acute cholecystitis - Patient with clinical and radiographic findings consistent with acute cholecystitis. Recommend admission for laparoscopic cholecystectomy. Patient is agreeable. Ok for clear liquids today, NPO after midnight. Repeat labs in AM.  ID - maxipime/flagyl VTE - lovenox FEN - IVF, CLD, NPO after midnight Foley - none  DM - SSI  I reviewed ED provider notes, last 24 h vitals and pain scores, last 48 h intake and output, last 24 h labs and trends, and last 24 h imaging results.   Franne Forts, PA-C [facility] 12/17/2021, 2:12 PM Please see Amion for pager number during day hours 7:00am-4:30pm

## 2021-12-17 NOTE — ED Provider Notes (Signed)
[facility] EMERGENCY DEPARTMENT Provider Note   CSN: [id number] Arrival date & time: 12/17/21  4098     History  Chief Complaint  Patient presents with   Abdominal Pain   Nausea   Emesis    Jasmine Vasquez is a 51 y.o. female.  Pt with c/o lower abdominal pain in the past three days. Symptoms acute onset, waxing and waning, more or less constant today. Non radiating. Mod-severe. Had similar pain one time in past but it got better/not sure of cause. No prior abd surgery. No hx diverticula. No dysuria or gu c/o. No vaginal discharge or bleeding. Had normal bm one hour ago. Two episodes of emesis, not bloody. No fever.   The history is provided by the patient, the spouse and medical records. A language interpreter was used (av interpreter service used).  Abdominal Pain Associated symptoms: vomiting   Associated symptoms: no chest pain, no chills, no cough, no dysuria, no fever, no shortness of breath and no sore throat   Emesis Associated symptoms: abdominal pain   Associated symptoms: no chills, no cough, no fever, no headaches and no sore throat        Home Medications Prior to Admission medications   Medication Sig Start Date End Date Taking? Authorizing Provider  AMLODIPINE BESYLATE PO Take 5 mg by mouth.    [provider]  atenolol (TENORMIN) 50 MG tablet Take 50 mg by mouth daily.    [provider]  bacitracin 500 UNIT/GM ointment Apply 1 application topically 2 (two) times daily.    [provider]  glipiZIDE (GLUCOTROL) 5 MG tablet Take by mouth daily before breakfast.    [provider]  ibuprofen (ADVIL,MOTRIN) 600 MG tablet Take 1 tablet (600 mg total) by mouth every 6 (six) hours. 12/28/11   Napoleon Form, MD  lovastatin (MEVACOR) 20 MG tablet Take 20 mg by mouth at bedtime.    [provider]  metFORMIN (GLUCOPHAGE) 1000 MG tablet Take 1,000 mg by mouth 2 (two) times daily with a meal.    [provider]  Prenatal Vit-Fe Fumarate-FA (PRENATAL MULTIVITAMIN) TABS Take 1 tablet by mouth every morning.    [provider]  silver sulfADIAZINE (SILVADENE) 1 % cream Apply 1 application topically daily. 06/06/18   Wieters, Hallie C, PA-C      Allergies    Penicillins    Review of Systems   Review of Systems  Constitutional:  Negative for chills and fever.  HENT:  Negative for sore throat.   Eyes:  Negative for redness.  Respiratory:  Negative for cough and shortness of breath.   Cardiovascular:  Negative for chest pain.  Gastrointestinal:  Positive for abdominal pain and vomiting.  Genitourinary:  Negative for dysuria and flank pain.  Musculoskeletal:  Negative for back pain.  Skin:  Negative for rash.  Neurological:  Negative for headaches.  Hematological:  Does not bruise/bleed easily.  Psychiatric/Behavioral:  Negative for confusion.     Physical Exam Updated Vital Signs BP 126/88   Pulse 88   Temp 98.2 F (36.8 C) (Oral)   Resp 16   SpO2 99%  Physical Exam Vitals and nursing note reviewed.  Constitutional:      Appearance: Normal appearance. She is well-developed.  HENT:     Head: Atraumatic.     Nose: Nose normal.     Mouth/Throat:     Mouth: Mucous membranes are moist.  Eyes:     General: No scleral  icterus.    Conjunctiva/sclera: Conjunctivae normal.  Neck:     Trachea: No tracheal deviation.  Cardiovascular:     Rate and Rhythm: Normal rate and regular rhythm.     Pulses: Normal pulses.     Heart sounds: Normal heart sounds. No murmur heard.    No friction rub. No gallop.  Pulmonary:     Effort: Pulmonary effort is normal. No respiratory distress.     Breath sounds: Normal breath sounds.  Abdominal:     General: Bowel sounds are normal. There is no distension.     Palpations: Abdomen is soft.     Tenderness: There is abdominal tenderness. There is guarding.     Comments: Bil mid to lower abd tenderness.   Genitourinary:    Comments:  No cva tenderness.  Musculoskeletal:        General: No swelling.     Cervical back: Normal range of motion and neck supple. No rigidity. No muscular tenderness.     Right lower leg: No edema.     Left lower leg: No edema.  Skin:    General: Skin is warm and dry.     Findings: No rash.  Neurological:     Mental Status: She is alert.     Comments: Alert, speech normal.   Psychiatric:        Mood and Affect: Mood normal.     ED Results / Procedures / Treatments   Labs (all labs ordered are listed, but only abnormal results are displayed)  Results for orders placed or performed during the hospital encounter of 12/17/21  CBC  Result Value Ref Range   WBC 19.7 (H) 4.0 - 10.5 K/uL   RBC 4.99 3.87 - 5.11 MIL/uL   Hemoglobin 15.4 (H) 12.0 - 15.0 g/dL   HCT 35.3 (H) 29.9 - 24.2 %   MCV 96.8 80.0 - 100.0 fL   MCH 30.9 26.0 - 34.0 pg   MCHC 31.9 30.0 - 36.0 g/dL   RDW 68.3 41.9 - 62.2 %   Platelets 181 150 - 400 K/uL   nRBC 0.0 0.0 - 0.2 %  Comprehensive metabolic panel  Result Value Ref Range   Sodium 133 (L) 135 - 145 mmol/L   Potassium 3.2 (L) 3.5 - 5.1 mmol/L   Chloride 105 98 - 111 mmol/L   CO2 16 (L) 22 - 32 mmol/L   Glucose, Bld 206 (H) 70 - 99 mg/dL   BUN 7 6 - 20 mg/dL   Creatinine, Ser 2.97 0.44 - 1.00 mg/dL   Calcium 9.2 8.9 - 98.9 mg/dL   Total Protein 8.2 (H) 6.5 - 8.1 g/dL   Albumin 3.7 3.5 - 5.0 g/dL   AST 15 15 - 41 U/L   ALT 19 0 - 44 U/L   Alkaline Phosphatase 98 38 - 126 U/L   Total Bilirubin 2.1 (H) 0.3 - 1.2 mg/dL   GFR, Estimated >21 >19 mL/min   Anion gap 12 5 - 15  Lipase, blood  Result Value Ref Range   Lipase 28 11 - 51 U/L  I-Stat beta hCG blood, ED  Result Value Ref Range   I-stat hCG, quantitative <5.0 <5 mIU/mL   Comment 3             EKG None  Radiology CT Abdomen Pelvis W Contrast  Result Date: 12/17/2021 CLINICAL DATA:  Abdominal pain. EXAM: CT ABDOMEN AND PELVIS WITH CONTRAST TECHNIQUE: Multidetector CT imaging of the abdomen  and pelvis was performed  using the standard protocol following bolus administration of intravenous contrast. RADIATION DOSE REDUCTION: This exam was performed according to the departmental dose-optimization program which includes automated exposure control, adjustment of the mA and/or kV according to patient size and/or use of iterative reconstruction technique. CONTRAST:  75mL OMNIPAQUE IOHEXOL 350 MG/ML SOLN COMPARISON:  10/04/2010 CT AP FINDINGS: Lower chest: Bibasilar atelectasis. Hepatobiliary: Hepatic steatosis. Liver has a normal contour. No focal liver lesions are visualized. No evidence of intra or extrahepatic biliary ductal dilatation. Portal veins are contrast opacified. There is a small amount of perihepatic fluid that tracks inferiorly into the right pericolic gutter. The gallbladder is markedly distended gallbladder wall thickening and pericholecystic fat stranding. There are a few layering gallstones. There is likely an obstructing gallstone in the cystic duct (series 6, image 40). Pancreas: There is no evidence of peripancreatic fat stranding. Pancreatic duct is normal in size. No focal pancreatic lesion is visualized. Spleen: Normal in size without focal abnormality. Adrenals/Urinary Tract: Bilateral adrenal glands are normal in size possible hyperenhancement. Bilateral kidneys enhance homogeneously and symmetrically. There is no evidence of hydronephrosis or nephrolithiasis. No focal renal lesions are visualized. The urinary bladder is fluid-filled without focal wall thickening. There is no evidence of Peri cystic fat stranding. There is trace free fluid in the pelvis. Stomach/Bowel: The stomach, small bowel, large bowel are normal in caliber. There is no evidence of bowel obstruction. Fat stranding in the right upper quadrant of the hepatic flexure is favored to be reactive. Appendix is normal in appearance. Vascular/Lymphatic: No significant vascular findings are present. No enlarged abdominal or  pelvic lymph nodes. Reproductive: Uterus and bilateral adnexa are unremarkable. Other: No abdominal wall hernia or abnormality. No abdominopelvic ascites. Musculoskeletal: No acute or significant osseous findings. IMPRESSION: 1. Findings compatible with acute cholecystitis with an obstructing gallstone in the cystic duct. 2. Small amount of perihepatic fluid tracking inferiorly into the right pericolic gutter, likely reactive 3. Hyperenhanacement of the bilateral adrenal glands is nonspecific, but could be seen in the setting of hypotension. Electronically Signed   By: Lorenza CambridgeHemant  Desai M.D.   On: 12/17/2021 13:25    Procedures Procedures    Medications Ordered in ED Medications  sodium chloride 0.9 % bolus 1,000 mL (has no administration in time range)  ondansetron (ZOFRAN) injection 4 mg (has no administration in time range)  sodium chloride 0.9 % bolus 1,000 mL (has no administration in time range)  HYDROmorphone (DILAUDID) injection 0.5 mg (has no administration in time range)  ondansetron (ZOFRAN) injection 4 mg (has no administration in time range)    ED Course/ Medical Decision Making/ A&P                           Medical Decision Making Problems Addressed: Acute cholecystitis: acute illness or injury with systemic symptoms that poses a threat to life or bodily functions Dehydration: acute illness or injury with systemic symptoms that poses a threat to life or bodily functions Gallstones: acute illness or injury Generalized abdominal pain: acute illness or injury with systemic symptoms Hypokalemia: acute illness or injury Nausea and vomiting in adult: acute illness or injury with systemic symptoms  Amount and/or Complexity of Data Reviewed Independent Historian: spouse    Details: hx External Data Reviewed: notes. Labs: ordered. Decision-making details documented in ED Course. Radiology: ordered and independent interpretation performed. Decision-making details documented in ED  Course. Discussion of management or test interpretation with external provider(s): Gen surg  Risk  Prescription drug management. Parenteral controlled substances. Decision regarding hospitalization.   Iv ns. Continuous pulse ox and cardiac monitoring. Labs ordered/sent. Imaging ordered.   Reviewed nursing notes and prior charts for additional history. External reports reviewed. Additional history from: spouse.  Cardiac monitor: sinus rhythm, rate 88.  Labs reviewed/interpreted by me - wbc elevated. Preg neg. K low. Kcl iv.   Ns bolus iv. Dilaudid iv. Zofran iv.   CT reviewed/interpreted by me - gallstones ?cholecystitis.   Allergy to pcn. Cefepime and flagyl iv. Lr bolus. Pain improved from prior.  General surgery consulted.   Plan for admission.          Final Clinical Impression(s) / ED Diagnoses Final diagnoses:  None    Rx / DC Orders ED Discharge Orders     None         Cathren Laine, MD 12/17/21 1339

## 2021-12-18 ENCOUNTER — Other Ambulatory Visit: Payer: Self-pay

## 2021-12-18 ENCOUNTER — Encounter (HOSPITAL_COMMUNITY): Admission: EM | Disposition: A | Discharge status: Home / Self Care | Payer: Self-pay | Source: Home / Self Care

## 2021-12-18 ENCOUNTER — Encounter (HOSPITAL_COMMUNITY): Payer: Self-pay

## 2021-12-18 ENCOUNTER — Observation Stay (HOSPITAL_COMMUNITY): Payer: Self-pay | Admitting: Certified Registered"

## 2021-12-18 ENCOUNTER — Observation Stay (HOSPITAL_COMMUNITY): Payer: Self-pay

## 2021-12-18 DIAGNOSIS — K810 Acute cholecystitis: Secondary | ICD-10-CM

## 2021-12-18 HISTORY — PX: CHOLECYSTECTOMY: SHX55

## 2021-12-18 LAB — COMPREHENSIVE METABOLIC PANEL
ALT: 20 U/L (ref 0–44)
AST: 17 U/L (ref 15–41)
Albumin: 2.8 g/dL — ABNORMAL LOW (ref 3.5–5.0)
Alkaline Phosphatase: 84 U/L (ref 38–126)
Anion gap: 11 (ref 5–15)
BUN: <5 mg/dL — ABNORMAL LOW (ref 6–20)
CO2: 16 mmol/L — ABNORMAL LOW (ref 22–32)
Calcium: 8.5 mg/dL — ABNORMAL LOW (ref 8.9–10.3)
Chloride: 107 mmol/L (ref 98–111)
Creatinine, Ser: 0.56 mg/dL (ref 0.44–1.00)
GFR, Estimated: >60 mL/min (ref >60)
Glucose, Bld: 186 mg/dL — ABNORMAL HIGH (ref 70–99)
Potassium: 3.1 mmol/L — ABNORMAL LOW (ref 3.5–5.1)
Sodium: 134 mmol/L — ABNORMAL LOW (ref 135–145)
Total Bilirubin: 2.2 mg/dL — ABNORMAL HIGH (ref 0.3–1.2)
Total Protein: 6.3 g/dL — ABNORMAL LOW (ref 6.5–8.1)

## 2021-12-18 LAB — CBC
HCT: 40 % (ref 36.0–46.0)
Hemoglobin: 13.9 g/dL (ref 12.0–15.0)
MCH: 32.3 pg (ref 26.0–34.0)
MCHC: 34.8 g/dL (ref 30.0–36.0)
MCV: 92.8 fL (ref 80.0–100.0)
Platelets: 287 10*3/uL (ref 150–400)
RBC: 4.31 MIL/uL (ref 3.87–5.11)
RDW: 12.7 % (ref 11.5–15.5)
WBC: 23.8 10*3/uL — ABNORMAL HIGH (ref 4.0–10.5)
nRBC: 0 % (ref 0.0–0.2)

## 2021-12-18 LAB — GLUCOSE, CAPILLARY
Glucose-Capillary: 189 mg/dL — ABNORMAL HIGH (ref 70–99)
Glucose-Capillary: 172 mg/dL — ABNORMAL HIGH (ref 70–99)
Glucose-Capillary: 178 mg/dL — ABNORMAL HIGH (ref 70–99)
Glucose-Capillary: 183 mg/dL — ABNORMAL HIGH (ref 70–99)

## 2021-12-18 LAB — SURGICAL PCR SCREEN
MRSA, PCR: NEGATIVE
Staphylococcus aureus: NEGATIVE

## 2021-12-18 LAB — HEMOGLOBIN A1C
Hgb A1c MFr Bld: 9.5 % — ABNORMAL HIGH (ref 4.8–5.6)
Mean Plasma Glucose: 226 mg/dL

## 2021-12-18 SURGERY — LAPAROSCOPIC CHOLECYSTECTOMY
Anesthesia: General

## 2021-12-18 MED ORDER — BUPIVACAINE-EPINEPHRINE 0.25% -1:200000 IJ SOLN
INTRAMUSCULAR | Status: DC | PRN
  Administered 2021-12-18: 25 mL

## 2021-12-18 MED ORDER — SODIUM CHLORIDE 0.9 % IV SOLN
2.0000 g | Freq: Once | INTRAVENOUS | Status: AC
  Administered 2021-12-18: 2 g via INTRAVENOUS

## 2021-12-18 MED ORDER — DEXMEDETOMIDINE HCL IN NACL 80 MCG/20ML IV SOLN
INTRAVENOUS | Status: DC | PRN
  Administered 2021-12-18: 8 ug via BUCCAL

## 2021-12-18 MED ORDER — PHENYLEPHRINE 80 MCG/ML (10ML) SYRINGE FOR IV PUSH (FOR BLOOD PRESSURE SUPPORT)
PREFILLED_SYRINGE | INTRAVENOUS | Status: DC | PRN
  Administered 2021-12-18 (×2): 80 ug via INTRAVENOUS

## 2021-12-18 MED ORDER — FENTANYL CITRATE (PF) 250 MCG/5ML IJ SOLN
INTRAMUSCULAR | Status: DC | PRN
  Administered 2021-12-18 (×3): 50 ug via INTRAVENOUS

## 2021-12-18 MED ORDER — 0.9 % SODIUM CHLORIDE (POUR BTL) OPTIME
TOPICAL | Status: DC | PRN
  Administered 2021-12-18: 1000 mL

## 2021-12-18 MED ORDER — ACETAMINOPHEN 10 MG/ML IV SOLN
1000.0000 mg | Freq: Once | INTRAVENOUS | Status: DC | PRN
  Administered 2021-12-18: 1000 mg via INTRAVENOUS

## 2021-12-18 MED ORDER — MIDAZOLAM HCL 2 MG/2ML IJ SOLN
INTRAMUSCULAR | Status: AC
  Filled 2021-12-18: qty 2

## 2021-12-18 MED ORDER — LIDOCAINE 2% (20 MG/ML) 5 ML SYRINGE
INTRAMUSCULAR | Status: DC | PRN
  Administered 2021-12-18: 60 mg via INTRAVENOUS

## 2021-12-18 MED ORDER — ORAL CARE MOUTH RINSE: 15.0000 mL | Freq: Once | OROMUCOSAL | Status: AC

## 2021-12-18 MED ORDER — MIDAZOLAM HCL 2 MG/2ML IJ SOLN
INTRAMUSCULAR | Status: DC | PRN
  Administered 2021-12-18: 2 mg via INTRAVENOUS

## 2021-12-18 MED ORDER — PROPOFOL 10 MG/ML IV BOLUS
INTRAVENOUS | Status: DC | PRN
  Administered 2021-12-18: 120 mg via INTRAVENOUS

## 2021-12-18 MED ORDER — LACTATED RINGERS IV SOLN: INTRAVENOUS | Status: DC

## 2021-12-18 MED ORDER — HYDROMORPHONE HCL 1 MG/ML IJ SOLN
0.5000 mg | INTRAMUSCULAR | Status: DC | PRN
  Administered 2021-12-18 – 2021-12-19 (×3): 1 mg via INTRAVENOUS
  Filled 2021-12-18 (×3): qty 1

## 2021-12-18 MED ORDER — OXYCODONE HCL 5 MG PO TABS: 5.0000 mg | ORAL_TABLET | Freq: Once | ORAL | Status: DC | PRN

## 2021-12-18 MED ORDER — OXYCODONE HCL 5 MG PO TABS
5.0000 mg | ORAL_TABLET | ORAL | Status: DC | PRN
  Administered 2021-12-18 – 2021-12-20 (×7): 10 mg via ORAL
  Filled 2021-12-18 (×7): qty 2

## 2021-12-18 MED ORDER — FENTANYL CITRATE (PF) 250 MCG/5ML IJ SOLN
INTRAMUSCULAR | Status: AC
  Filled 2021-12-18: qty 5

## 2021-12-18 MED ORDER — ACETAMINOPHEN 500 MG PO TABS: 1000.0000 mg | ORAL_TABLET | Freq: Four times a day (QID) | ORAL | Status: DC | PRN

## 2021-12-18 MED ORDER — ACETAMINOPHEN 160 MG/5ML PO SOLN: 1000.0000 mg | Freq: Once | ORAL | Status: DC | PRN

## 2021-12-18 MED ORDER — CHLORHEXIDINE GLUCONATE 0.12 % MT SOLN
15.0000 mL | Freq: Once | OROMUCOSAL | Status: AC
  Administered 2021-12-18: 15 mL via OROMUCOSAL

## 2021-12-18 MED ORDER — SODIUM CHLORIDE 0.9 % IV SOLN
2.0000 g | INTRAVENOUS | Status: DC
  Administered 2021-12-18 – 2021-12-19 (×2): 2 g via INTRAVENOUS
  Filled 2021-12-18 (×3): qty 20

## 2021-12-18 MED ORDER — PROPOFOL 10 MG/ML IV BOLUS
INTRAVENOUS | Status: AC
  Filled 2021-12-18: qty 20

## 2021-12-18 MED ORDER — DEXAMETHASONE SODIUM PHOSPHATE 10 MG/ML IJ SOLN
INTRAMUSCULAR | Status: DC | PRN
  Administered 2021-12-18: 10 mg via INTRAVENOUS

## 2021-12-18 MED ORDER — ACETAMINOPHEN 500 MG PO TABS: 1000.0000 mg | ORAL_TABLET | Freq: Once | ORAL | Status: DC | PRN

## 2021-12-18 MED ORDER — FENTANYL CITRATE (PF) 100 MCG/2ML IJ SOLN
INTRAMUSCULAR | Status: AC
  Filled 2021-12-18: qty 2

## 2021-12-18 MED ORDER — OXYCODONE HCL 5 MG/5ML PO SOLN: 5.0000 mg | Freq: Once | ORAL | Status: DC | PRN

## 2021-12-18 MED ORDER — SODIUM CHLORIDE 0.9 % IR SOLN
Status: DC | PRN
  Administered 2021-12-18 (×2): 1000 mL

## 2021-12-18 MED ORDER — ONDANSETRON HCL 4 MG/2ML IJ SOLN
INTRAMUSCULAR | Status: DC | PRN
  Administered 2021-12-18: 4 mg via INTRAVENOUS

## 2021-12-18 MED ORDER — BUPIVACAINE-EPINEPHRINE (PF) 0.25% -1:200000 IJ SOLN
INTRAMUSCULAR | Status: AC
  Filled 2021-12-18: qty 30

## 2021-12-18 MED ORDER — ROCURONIUM BROMIDE 10 MG/ML (PF) SYRINGE
PREFILLED_SYRINGE | INTRAVENOUS | Status: DC | PRN
  Administered 2021-12-18: 80 mg via INTRAVENOUS
  Administered 2021-12-18: 20 mg via INTRAVENOUS

## 2021-12-18 MED ORDER — SUGAMMADEX SODIUM 200 MG/2ML IV SOLN
INTRAVENOUS | Status: DC | PRN
  Administered 2021-12-18: 200 mg via INTRAVENOUS

## 2021-12-18 MED ORDER — FENTANYL CITRATE (PF) 100 MCG/2ML IJ SOLN
25.0000 ug | INTRAMUSCULAR | Status: DC | PRN
  Administered 2021-12-18: 50 ug via INTRAVENOUS

## 2021-12-18 MED ORDER — METRONIDAZOLE 500 MG/100ML IV SOLN
INTRAVENOUS | Status: DC | PRN
  Administered 2021-12-18: 500 mg via INTRAVENOUS

## 2021-12-18 MED ORDER — ACETAMINOPHEN 10 MG/ML IV SOLN: 1000.0000 mg | Freq: Once | INTRAVENOUS | Status: DC

## 2021-12-18 SURGICAL SUPPLY — 33 items
APPLIER CLIP 5 13 M/L LIGAMAX5 (MISCELLANEOUS) ×1
BAG COUNTER SPONGE SURGICOUNT (BAG) ×1 IMPLANT
BLADE CLIPPER SURG (BLADE) IMPLANT
CANISTER SUCT 3000ML PPV (MISCELLANEOUS) ×1 IMPLANT
CHLORAPREP W/TINT 26 (MISCELLANEOUS) ×1 IMPLANT
CLIP APPLIE 5 13 M/L LIGAMAX5 (MISCELLANEOUS) ×1 IMPLANT
COVER SURGICAL LIGHT HANDLE (MISCELLANEOUS) ×1 IMPLANT
DERMABOND ADVANCED .7 DNX12 (GAUZE/BANDAGES/DRESSINGS) ×1 IMPLANT
ELECT REM PT RETURN 9FT ADLT (ELECTROSURGICAL) ×1
ELECTRODE REM PT RTRN 9FT ADLT (ELECTROSURGICAL) ×1 IMPLANT
GLOVE BIO SURGEON STRL SZ7.5 (GLOVE) ×1 IMPLANT
GLOVE BIOGEL PI IND STRL 6 (GLOVE) IMPLANT
GOWN STRL REUS W/ TWL LRG LVL3 (GOWN DISPOSABLE) ×3 IMPLANT
GOWN STRL REUS W/TWL LRG LVL3 (GOWN DISPOSABLE) ×3
KIT BASIN OR (CUSTOM PROCEDURE TRAY) ×1 IMPLANT
KIT TURNOVER KIT B (KITS) ×1 IMPLANT
NS IRRIG 1000ML POUR BTL (IV SOLUTION) ×1 IMPLANT
PAD ARMBOARD 7.5X6 YLW CONV (MISCELLANEOUS) ×1 IMPLANT
SCISSORS LAP 5X35 DISP (ENDOMECHANICALS) ×1 IMPLANT
SET IRRIG TUBING LAPAROSCOPIC (IRRIGATION / IRRIGATOR) ×1 IMPLANT
SET TUBE SMOKE EVAC HIGH FLOW (TUBING) ×1 IMPLANT
SLEEVE ENDOPATH XCEL 5M (ENDOMECHANICALS) ×2 IMPLANT
SLEEVE Z-THREAD 5X100MM (TROCAR) IMPLANT
SPECIMEN JAR SMALL (MISCELLANEOUS) ×1 IMPLANT
SUT MNCRL AB 4-0 PS2 18 (SUTURE) ×1 IMPLANT
SYS BAG RETRIEVAL 10MM (BASKET) ×1
SYSTEM BAG RETRIEVAL 10MM (BASKET) IMPLANT
TOWEL GREEN STERILE (TOWEL DISPOSABLE) ×1 IMPLANT
TOWEL GREEN STERILE FF (TOWEL DISPOSABLE) ×1 IMPLANT
TRAY LAPAROSCOPIC MC (CUSTOM PROCEDURE TRAY) ×1 IMPLANT
TROCAR BALLN 12MMX100 BLUNT (TROCAR) IMPLANT
TROCAR Z-THREAD OPTICAL 5X100M (TROCAR) ×1 IMPLANT
WATER STERILE IRR 1000ML POUR (IV SOLUTION) ×1 IMPLANT

## 2021-12-18 NOTE — Anesthesia Procedure Notes (Addendum)
Procedure Name: Intubation Date/Time: 12/18/2021 1:20 PM  Performed by: Griffin Dakin, CRNAPre-anesthesia Checklist: Patient identified, Emergency Drugs available, Suction available and Patient being monitored Patient Re-evaluated:Patient Re-evaluated prior to induction Oxygen Delivery Method: Circle system utilized Preoxygenation: Pre-oxygenation with 100% oxygen Induction Type: IV induction Ventilation: Mask ventilation without difficulty Laryngoscope Size: Mac and 4 Grade View: Grade I Tube type: Oral Tube size: 7.0 mm Number of attempts: 1 Airway Equipment and Method: Stylet and Oral airway Placement Confirmation: ETT inserted through vocal cords under direct vision, positive ETCO2 and breath sounds checked- equal and bilateral Secured at: 22 cm Tube secured with: Tape Dental Injury: Teeth and Oropharynx as per pre-operative assessment

## 2021-12-18 NOTE — Progress Notes (Signed)
S/p cholecystectomy. Ok to change cefepime to ceftriaxone per Leary Roca.  Ulyses Southward, PharmD, BCIDP, AAHIVP, CPP Infectious Disease Pharmacist 12/18/2021 4:16 PM

## 2021-12-18 NOTE — Op Note (Signed)
12/17/2021 - 12/18/2021  2:54 PM  PATIENT:  Jasmine Vasquez  51 y.o. female  PRE-OPERATIVE DIAGNOSIS:  ACUTE CHOLECYSTITIS  POST-OPERATIVE DIAGNOSIS:  ACUTE CHOLECYSTITIS  PROCEDURE:  Procedure(s): LAPAROSCOPIC CHOLECYSTECTOMY (N/A)  SURGEON:  Surgeon(s) and Role:    * Jovita Kussmaul, MD - Primary  PHYSICIAN ASSISTANT:   ASSISTANTS: none   ANESTHESIA:   local and general  EBL:  50 mL   BLOOD ADMINISTERED:none  DRAINS: none   LOCAL MEDICATIONS USED:  MARCAINE     SPECIMEN:  Source of Specimen:  gallbladder  DISPOSITION OF SPECIMEN:  PATHOLOGY  COUNTS:  YES  TOURNIQUET:  * No tourniquets in log *  DICTATION: .Dragon Dictation    Procedure: After informed consent was obtained the patient was brought to the operating room and placed in the supine position on the operating room table. After adequate induction of general anesthesia the patient's abdomen was prepped with ChloraPrep allowed to dry and draped in usual sterile manner. An appropriate timeout was performed. The area below the umbilicus was infiltrated with quarter percent  Marcaine. A small incision was made with a 15 blade knife. The incision was carried down through the subcutaneous tissue bluntly with a hemostat and [employer] retractors. The linea alba was identified. The linea alba was incised with a 15 blade knife and each side was grasped with Coker clamps. The preperitoneal space was then probed with a hemostat until the peritoneum was opened and access was gained to the abdominal cavity. A 0 Vicryl pursestring stitch was placed in the fascia surrounding the opening. A Hassan cannula was then placed through the opening and anchored in place with the previously placed Vicryl purse string stitch. The abdomen was insufflated with carbon dioxide without difficulty. A laparoscope was inserted through the [facility] cannula in the right upper quadrant was inspected. Next the epigastric region was infiltrated with %  Marcaine. A small incision was made with a 15 blade knife. A 5 mm port was placed bluntly through this incision into the abdominal cavity under direct vision. Next 2 sites were chosen laterally on the right side of the abdomen for placement of 5 mm ports. Each of these areas was infiltrated with quarter percent Marcaine. Small stab incisions were made with a 15 blade knife. 5 mm ports were then placed bluntly through these incisions into the abdominal cavity under direct vision without difficulty.  The gallbladder was very tense and inflamed and required aspiration in order to grasp it.  We did aspirate because one of the gallbladder.  A blunt grasper was placed through the lateralmost 5 mm port and used to grasp the dome of the gallbladder and elevate it anteriorly and superiorly. Another blunt grasper was placed through the other 5 mm port and used to retract the body and neck of the gallbladder. A dissector was placed through the epigastric port and using the electrocautery the peritoneal reflection at the gallbladder neck was opened. Blunt dissection was then carried out in this area until the gallbladder neck-cystic duct junction was readily identified and a good critical window was created.  There was too much inflammation to safely get a cholangiogram.  3 clips were placed proximally 1 distally on the cystic duct and the duct was divided between the 2 sets of clips.  Posterior to this the cystic artery was identified and again dissected bluntly in a circumferential manner until a good window  was created. 2 clips were placed proximally and one distally on the artery and  the artery was divided between the 2 sets of clips. Next a laparoscopic hook cautery device was used to separate the gallbladder from the liver bed. Prior to completely detaching the gallbladder from the liver bed the liver bed was inspected and several small bleeding points were coagulated with the electrocautery until the area was completely  hemostatic. The gallbladder was then detached the rest of it from the liver bed without difficulty. A laparoscopic bag was inserted through the hassan port. The laparoscope was moved to the epigastric port. The gallbladder was placed within the bag and the bag was sealed.  The bag with the gallbladder was then removed with the [facility] cannula through the infraumbilical port without difficulty. The fascial defect was then closed with the previously placed Vicryl pursestring stitch as well as with another figure-of-eight 0 Vicryl stitch. The liver bed was inspected again and found to be hemostatic. The abdomen was irrigated with copious amounts of saline until the effluent was clear. The ports were then removed under direct vision without difficulty and were found to be hemostatic. The gas was allowed to escape. No other abnormalities were noted on general inspection of the abdomen. The skin incisions were all closed with interrupted 4-0 Monocryl subcuticular stitches. Dermabond dressings were applied. The patient tolerated the procedure well. At the end of the case all needle sponge and instrument counts were correct. The patient was then awakened and taken to recovery in stable condition  PLAN OF CARE: Admit to inpatient   PATIENT DISPOSITION:  PACU - hemodynamically stable.   Delay start of Pharmacological VTE agent (>24hrs) due to surgical blood loss or risk of bleeding: no

## 2021-12-18 NOTE — Anesthesia Preprocedure Evaluation (Signed)
Anesthesia Evaluation  Patient identified by MRN, date of birth, ID band Patient awake    Reviewed: Allergy & Precautions, H&P , NPO status , Patient's Chart, lab work & pertinent test results  History of Anesthesia Complications Negative for: history of anesthetic complications  Airway Mallampati: II  TM Distance: >3 FB Neck ROM: Full    Dental  (+) Teeth Intact, Dental Advisory Given   Pulmonary neg pulmonary ROS   breath sounds clear to auscultation       Cardiovascular hypertension, negative cardio ROS  Rhythm:Regular     Neuro/Psych negative neurological ROS  negative psych ROS   GI/Hepatic negative GI ROS, Neg liver ROS,,,  Endo/Other  diabetes    Renal/GU negative Renal ROS     Musculoskeletal negative musculoskeletal ROS (+)    Abdominal   Peds  Hematology negative hematology ROS (+)   Anesthesia Other Findings   Reproductive/Obstetrics                             Anesthesia Physical Anesthesia Plan  ASA: 2  Anesthesia Plan: General   Post-op Pain Management:    Induction: Intravenous  PONV Risk Score and Plan: 3 and Ondansetron and Dexamethasone  Airway Management Planned: Oral ETT  Additional Equipment: None  Intra-op Plan:   Post-operative Plan: Extubation in OR  Informed Consent: I have reviewed the patients History and Physical, chart, labs and discussed the procedure including the risks, benefits and alternatives for the proposed anesthesia with the patient or authorized representative who has indicated his/her understanding and acceptance.     Dental advisory given  Plan Discussed with: CRNA  Anesthesia Plan Comments:        Anesthesia Quick Evaluation

## 2021-12-18 NOTE — Discharge Instructions (Signed)
CIRUGIA LAPAROSCOPICA: INSTRUCCIONES DE POST OPERATORIO.  Revise siempre los documentos que le entreguen en el lugar donde se ha hecho la cirugia.  SI USTED NECESITA DOCUMENTOS DE INCAPACIDAD (DISABLE) O DE PERMISO FAMILAR (FAMILY LEAVE) NECESITA TRAERLOS A LA OFICINA PARA QUE SEAN PROCESADOS. NO  SE LOS DE A SU DOCTOR. A su alta del hospital se le dara una receta para controlar el dolor. Tomela como ha sido recetada, si la necesita. Si no la necesita puede tomar, Acetaminofen (Tylenol) o Ibuprofen (Advil) para aliviar dolor moderado. Continue tomando el resto de sus medicinas. Si necesita rellenar la receta, llame a la farmacia. ellos contactan a nuestra oficina pidiendo autorizacion. Este tipo de receta no pueden ser rellenadas despues de las  5pm o durante los fines de semana. Con relacion a la dieta: debe ser ligera los primeros dias despues que llege a la casa. Ejemplo: sopas y galleticas. Tome bastante liquido esos dias. La mayoria de los pacientes padecen de inflamacion y cambio de coloracion de la piel alrededor de las incisiones. esto toma dias en resolver.  pnerse una bolsa de hielo en el area affectada ayuda..  Es comun tambien tener un poco de estrenimiento si esta tomado medicinas para el dolor. incremente la cantidad de liquidos a tomar y puede tomar (Colace) esto previene el problema. Si ya tiene estrenimiento, es decir no ha defecado en 48 horas, puede tomar un laxativo (Milk of Magnesia or Miralax) uselo como el paquete le explica.  A menos que se le diga algo diferente. Remueva el bendaje a las 24-48 horas despues dela cirugia. y puede banarse en la ducha sin ningun problema. usted puede tener steri-strips (pequenas curitas transparentes en la piel puesta encima de la incision)  Estas banditas strips should be left on the skin for 7-10 days.   Si su cirujano puso pegamento encima de la incision usted puede banarse bajo la ducha en 24 horas. Este pegamento empezara a caerse en las  proximas 2-3 semanas. Si le pusieron suturas o presillas (grapos) estos seran quitados en su proxima cita en la oficina. . ACTIVIDADES:  Puede hacer actividad ligera.  Como caminar , subir escaleras y poco a poco irlas incrementando tanto como las tolere. Puede tener relaciones sexuales cuando sea comfortable. No carge objetos pesados o haga esfuerzos que no sean aprovados por su doctor. Puede manejar en cuanto no esta tomando medicamentos fuertes (narcoticos) para el dolor, pueda abrochar confortablemente el cinturon de seguridad, y pueda maniobrar y usar los pedales de su vehiculo con seguridad. PUEDE REGRESAR A TRABAJAR  Debe ver a su doctor para una cita de seguimiento en 2-3 semanas despues de la cirugia.  OTRAS ISNSTRUCCIONES:___________________________________________________________________________________ CUANDO LLAMAR A SU MEDICO: FIEBRE mayor de  101.0 No produccion de orina. Sangramiento continue de la herida Incremento de dolor, enrojecimientio o drenaje de la herida (incision) Incremento de dolor abdominal.  The clinic staff is available to answer your questions during regular business hours.  Please don't hesitate to call and ask to speak to one of the nurses for clinical concerns.  If you have a medical emergency, go to the nearest emergency room or call 911.  A surgeon from [facility] is always on call at the hospital. [address], [address], [place], [place]  [address] ? [address] [address], [place], [place]   [address] [phone] ? [phone] ? FAX [phone] Web site: [email or web address]  

## 2021-12-18 NOTE — Interval H&P Note (Signed)
History and Physical Interval Note:  12/18/2021 1:05 PM  Jasmine Vasquez  has presented today for surgery, with the diagnosis of ACUTE CHOLECYSTITIS.  The various methods of treatment have been discussed with the patient and family. After consideration of risks, benefits and other options for treatment, the patient has consented to  Procedure(s): LAPAROSCOPIC CHOLECYSTECTOMY POSSIBLY WITH INTRAOPERATIVE CHOLANGIOGRAM (N/A) as a surgical intervention.  The patient's history has been reviewed, patient examined, no change in status, stable for surgery.  I have reviewed the patient's chart and labs.  Questions were answered to the patient's satisfaction.     Chevis Pretty III

## 2021-12-18 NOTE — Plan of Care (Signed)
  Problem: Education: Goal: Ability to describe self-care measures that may prevent or decrease complications (Diabetes Survival Skills Education) will improve Outcome: Progressing   Problem: Coping: Goal: Ability to adjust to condition or change in health will improve Outcome: Progressing   Problem: Education: Goal: Knowledge of General Education information will improve Description: Including pain rating scale, medication(s)/side effects and non-pharmacologic comfort measures Outcome: Progressing   Problem: Activity: Goal: Risk for activity intolerance will decrease Outcome: Progressing   Problem: Coping: Goal: Level of anxiety will decrease Outcome: Progressing   Problem: Pain Managment: Goal: General experience of comfort will improve Outcome: Progressing   Problem: Safety: Goal: Ability to remain free from injury will improve Outcome: Progressing   Problem: Skin Integrity: Goal: Risk for impaired skin integrity will decrease Outcome: Progressing

## 2021-12-18 NOTE — Transfer of Care (Addendum)
Immediate Anesthesia Transfer of Care Note  Patient: Jasmine Vasquez  Procedure(s) Performed: LAPAROSCOPIC CHOLECYSTECTOMY  Patient Location: PACU  Anesthesia Type:General  Level of Consciousness: oriented and drowsy  Airway & Oxygen Therapy: Patient Spontanous Breathing  Post-op Assessment: Report given to RN and Post -op Vital signs reviewed and stable  Post vital signs: Reviewed and stable  Last Vitals:  Vitals Value Taken Time  BP 93/66 12/18/21 1508  Temp    Pulse 85 12/18/21 1511  Resp 23 12/18/21 1511  SpO2 94 % 12/18/21 1511  Vitals shown include unvalidated device data.  Last Pain:  Vitals:   12/18/21 1238  TempSrc: Oral  PainSc: 6          Complications: No notable events documented.

## 2021-12-19 LAB — COMPREHENSIVE METABOLIC PANEL
ALT: 40 U/L (ref 0–44)
AST: 47 U/L — ABNORMAL HIGH (ref 15–41)
Albumin: 2.3 g/dL — ABNORMAL LOW (ref 3.5–5.0)
Alkaline Phosphatase: 87 U/L (ref 38–126)
Anion gap: 17 — ABNORMAL HIGH (ref 5–15)
BUN: 5 mg/dL — ABNORMAL LOW (ref 6–20)
CO2: 13 mmol/L — ABNORMAL LOW (ref 22–32)
Calcium: 8.4 mg/dL — ABNORMAL LOW (ref 8.9–10.3)
Chloride: 108 mmol/L (ref 98–111)
Creatinine, Ser: 0.72 mg/dL (ref 0.44–1.00)
GFR, Estimated: >60 mL/min (ref >60)
Glucose, Bld: 211 mg/dL — ABNORMAL HIGH (ref 70–99)
Potassium: 3 mmol/L — ABNORMAL LOW (ref 3.5–5.1)
Sodium: 138 mmol/L (ref 135–145)
Total Bilirubin: 1.1 mg/dL (ref 0.3–1.2)
Total Protein: 6 g/dL — ABNORMAL LOW (ref 6.5–8.1)

## 2021-12-19 LAB — GLUCOSE, CAPILLARY
Glucose-Capillary: 194 mg/dL — ABNORMAL HIGH (ref 70–99)
Glucose-Capillary: 247 mg/dL — ABNORMAL HIGH (ref 70–99)
Glucose-Capillary: 194 mg/dL — ABNORMAL HIGH (ref 70–99)
Glucose-Capillary: 234 mg/dL — ABNORMAL HIGH (ref 70–99)

## 2021-12-19 LAB — CBC
HCT: 38.7 % (ref 36.0–46.0)
Hemoglobin: 12.9 g/dL (ref 12.0–15.0)
MCH: 31.4 pg (ref 26.0–34.0)
MCHC: 33.3 g/dL (ref 30.0–36.0)
MCV: 94.2 fL (ref 80.0–100.0)
Platelets: 307 10*3/uL (ref 150–400)
RBC: 4.11 MIL/uL (ref 3.87–5.11)
RDW: 12.8 % (ref 11.5–15.5)
WBC: 22.2 10*3/uL — ABNORMAL HIGH (ref 4.0–10.5)
nRBC: 0 % (ref 0.0–0.2)

## 2021-12-19 NOTE — Plan of Care (Signed)
    Problem: Coping: Goal: Ability to adjust to condition or change in health will improve Outcome: Progressing   Problem: Education: Goal: Knowledge of General Education information will improve Description: Including pain rating scale, medication(s)/side effects and non-pharmacologic comfort measures Outcome: Progressing   

## 2021-12-19 NOTE — Progress Notes (Signed)
Patient walked in the hallway with her husband. Also gave incentive spirometer to patient and educated patient and family with the interpreter ipad.

## 2021-12-19 NOTE — Progress Notes (Signed)
1 Day Post-Op   Subjective/Chief Complaint: Translator used to communicate.  Has some ongoing abdominal pain but no worse.   Objective: Vital signs in last 24 hours: Temp:  [97.5 F (36.4 C)-98.8 F (37.1 C)] 98.3 F (36.8 C) (12/09 0758) Pulse Rate:  [79-94] 90 (12/09 0758) Resp:  [16-26] 18 (12/09 0758) BP: (92-116)/(64-74) 105/72 (12/09 0758) SpO2:  [93 %-95 %] 94 % (12/09 0758) Weight:  [78.5 kg] 78.5 kg (12/08 1238) Last BM Date : 12/17/21  Intake/Output from previous day: 12/08 0701 - 12/09 0700 In: 3975.6 [P.O.:240; I.V.:3435.6; IV Piggyback:300] Out: 1950 [Urine:1900; Blood:50] Intake/Output this shift: No intake/output data recorded.  General appearance: alert and cooperative Resp: clear to auscultation bilaterally Cardio: Normal sinus rhythm Incision/Wound: Port sites clean dry intact.  Abdomen soft nontender.  Lab Results:  Recent Labs    12/18/21 0331 12/19/21 0157  WBC 23.8* 22.2*  HGB 13.9 12.9  HCT 40.0 38.7  PLT 287 307   BMET Recent Labs    12/18/21 0331 12/19/21 0157  NA 134* 138  K 3.1* 3.0*  CL 107 108  CO2 16* 13*  GLUCOSE 186* 211*  BUN <5* 5*  CREATININE 0.56 0.72  CALCIUM 8.5* 8.4*   PT/INR No results for input(s): "LABPROT", "INR" in the last 72 hours. ABG No results for input(s): "PHART", "HCO3" in the last 72 hours.  Invalid input(s): "PCO2", "PO2"  Studies/Results: CT Abdomen Pelvis W Contrast  Result Date: 12/17/2021 CLINICAL DATA:  Abdominal pain. EXAM: CT ABDOMEN AND PELVIS WITH CONTRAST TECHNIQUE: Multidetector CT imaging of the abdomen and pelvis was performed using the standard protocol following bolus administration of intravenous contrast. RADIATION DOSE REDUCTION: This exam was performed according to the departmental dose-optimization program which includes automated exposure control, adjustment of the mA and/or kV according to patient size and/or use of iterative reconstruction technique. CONTRAST:  41mL OMNIPAQUE  IOHEXOL 350 MG/ML SOLN COMPARISON:  10/04/2010 CT AP FINDINGS: Lower chest: Bibasilar atelectasis. Hepatobiliary: Hepatic steatosis. Liver has a normal contour. No focal liver lesions are visualized. No evidence of intra or extrahepatic biliary ductal dilatation. Portal veins are contrast opacified. There is a small amount of perihepatic fluid that tracks inferiorly into the right pericolic gutter. The gallbladder is markedly distended gallbladder wall thickening and pericholecystic fat stranding. There are a few layering gallstones. There is likely an obstructing gallstone in the cystic duct (series 6, image 40). Pancreas: There is no evidence of peripancreatic fat stranding. Pancreatic duct is normal in size. No focal pancreatic lesion is visualized. Spleen: Normal in size without focal abnormality. Adrenals/Urinary Tract: Bilateral adrenal glands are normal in size possible hyperenhancement. Bilateral kidneys enhance homogeneously and symmetrically. There is no evidence of hydronephrosis or nephrolithiasis. No focal renal lesions are visualized. The urinary bladder is fluid-filled without focal wall thickening. There is no evidence of Peri cystic fat stranding. There is trace free fluid in the pelvis. Stomach/Bowel: The stomach, small bowel, large bowel are normal in caliber. There is no evidence of bowel obstruction. Fat stranding in the right upper quadrant of the hepatic flexure is favored to be reactive. Appendix is normal in appearance. Vascular/Lymphatic: No significant vascular findings are present. No enlarged abdominal or pelvic lymph nodes. Reproductive: Uterus and bilateral adnexa are unremarkable. Other: No abdominal wall hernia or abnormality. No abdominopelvic ascites. Musculoskeletal: No acute or significant osseous findings. IMPRESSION: 1. Findings compatible with acute cholecystitis with an obstructing gallstone in the cystic duct. 2. Small amount of perihepatic fluid tracking inferiorly into the  right pericolic gutter, likely reactive 3. Hyperenhanacement of the bilateral adrenal glands is nonspecific, but could be seen in the setting of hypotension. Electronically Signed   By: Lorenza Cambridge M.D.   On: 12/17/2021 13:25    Anti-infectives: Anti-infectives (From admission, onward)    Start     Dose/Rate Route Frequency Ordered Stop   12/18/21 2200  cefTRIAXone (ROCEPHIN) 2 g in sodium chloride 0.9 % 100 mL IVPB        2 g 200 mL/hr over 30 Minutes Intravenous Every 24 hours 12/18/21 1616     12/18/21 1400  ceFEPIme (MAXIPIME) 2 g in sodium chloride 0.9 % 100 mL IVPB        2 g 200 mL/hr over 30 Minutes Intravenous  Once 12/18/21 1350 12/18/21 1413   12/17/21 1345  ceFEPIme (MAXIPIME) 2 g in sodium chloride 0.9 % 100 mL IVPB       See Hyperspace for full Linked Orders Report.   2 g 200 mL/hr over 30 Minutes Intravenous  Once 12/17/21 1336 12/17/21 1445   12/17/21 1345  metroNIDAZOLE (FLAGYL) IVPB 500 mg       See Hyperspace for full Linked Orders Report.   500 mg 100 mL/hr over 60 Minutes Intravenous  Once 12/17/21 1336 12/17/21 1628       Assessment/Plan: s/p Procedure(s): LAPAROSCOPIC CHOLECYSTECTOMY (N/A) White count 22,000.  Will continue IV antibiotics for today.  Advance diet.  Ambulate.  Hope is that if her white count goes down we can discharge her tomorrow Monday.  LOS: 1 day    Dortha Schwalbe MD 12/19/2021

## 2021-12-20 LAB — CBC
HCT: 37.8 % (ref 36.0–46.0)
Hemoglobin: 13 g/dL (ref 12.0–15.0)
MCH: 31.9 pg (ref 26.0–34.0)
MCHC: 34.4 g/dL (ref 30.0–36.0)
MCV: 92.6 fL (ref 80.0–100.0)
Platelets: 316 10*3/uL (ref 150–400)
RBC: 4.08 MIL/uL (ref 3.87–5.11)
RDW: 13.1 % (ref 11.5–15.5)
WBC: 17.3 10*3/uL — ABNORMAL HIGH (ref 4.0–10.5)
nRBC: 0 % (ref 0.0–0.2)

## 2021-12-20 LAB — GLUCOSE, CAPILLARY: Glucose-Capillary: 211 mg/dL — ABNORMAL HIGH (ref 70–99)

## 2021-12-20 MED ORDER — IBUPROFEN 800 MG PO TABS: 800.0000 mg | ORAL_TABLET | Freq: Three times a day (TID) | ORAL | 0 refills | Status: DC | PRN

## 2021-12-20 MED ORDER — OXYCODONE HCL 5 MG PO TABS: 5.0000 mg | ORAL_TABLET | Freq: Four times a day (QID) | ORAL | 0 refills | Status: DC | PRN

## 2021-12-20 MED ORDER — AMOXICILLIN-POT CLAVULANATE 500-125 MG PO TABS: 1.0000 | ORAL_TABLET | Freq: Two times a day (BID) | ORAL | 0 refills | Status: DC

## 2021-12-20 NOTE — Discharge Summary (Signed)
Physician Discharge Summary  Patient ID: Jasmine Vasquez MRN: [id number] DOB/AGE: 1970/06/30 51 y.o.  Admit date: 12/17/2021 Discharge date: 12/20/2021  Admission Diagnoses: Acute cholecystitis  Discharge Diagnoses:  Principal Problem:   Acute cholecystitis   Discharged Condition: good  Hospital Course: Patient admitted on 12/17/2021 for acute cholecystitis.  She underwent laparoscopic cholecystectomy for gangrenous appendix by Dr. Carolynne Edouard.  Postop course was unremarkable.  She did have a bump in her white count and then came down by postop day 2-17,000.  Her pain was stable.  She was ambulating and tolerating her diet.  Her vital signs are stable.  Her examination showed typical postop changes without complication.  She was discharged home on postoperative day 2 in satisfactory condition.  She will continue antibiotics for 3 more days as an outpatient using Augmentin.    Significant Diagnostic Studies: labs: CBC    Component Value Date/Time   WBC 17.3 (H) 12/20/2021 0426   RBC 4.08 12/20/2021 0426   HGB 13.0 12/20/2021 0426   HCT 37.8 12/20/2021 0426   PLT 316 12/20/2021 0426   MCV 92.6 12/20/2021 0426   MCH 31.9 12/20/2021 0426   MCHC 34.4 12/20/2021 0426   RDW 13.1 12/20/2021 0426   LYMPHSABS 1.5 10/20/2011 0944   MONOABS 0.3 10/20/2011 0944   EOSABS 0.1 10/20/2011 0944   BASOSABS 0.0 10/20/2011 0944     Treatments: surgery: Laparoscopic cholecystectomy  Discharge Exam: Blood pressure 114/74, pulse 81, temperature 97.7 F (36.5 C), temperature source Oral, resp. rate 15, height 5\' 3"  (1.6 m), weight 78.5 kg, SpO2 97 %, currently breastfeeding. General appearance: alert and cooperative Resp: clear to auscultation bilaterally Cardio: Normal sinus rhythm Incision/Wound: Port sites clean dry intact.  Abdomen sore without peritonitis.  Discomfort appropriate  Disposition: Discharge disposition: 01-Home or Self Care       Discharge Instructions     Diet - low sodium  heart healthy   Complete by: As directed    Increase activity slowly   Complete by: As directed       Allergies as of 12/20/2021       Reactions   Penicillins Other (See Comments)   Dizziness        Medication List     TAKE these medications    amoxicillin-clavulanate 500-125 MG tablet Commonly known as: Augmentin Take 1 tablet by mouth 2 (two) times daily.   glipiZIDE 5 MG tablet Commonly known as: GLUCOTROL Take by mouth daily before breakfast.   ibuprofen 600 MG tablet Commonly known as: ADVIL Take 1 tablet (600 mg total) by mouth every 6 (six) hours. What changed:  when to take this reasons to take this   ibuprofen 800 MG tablet Commonly known as: ADVIL Take 1 tablet (800 mg total) by mouth every 8 (eight) hours as needed. What changed: You were already taking a medication with the same name, and this prescription was added. Make sure you understand how and when to take each.   metFORMIN 1000 MG tablet Commonly known as: GLUCOPHAGE Take 1,000 mg by mouth 2 (two) times daily with a meal.   NovoLIN N 100 UNIT/ML injection Generic drug: insulin NPH Human Inject 10 Units into the skin at bedtime.   oxyCODONE 5 MG immediate release tablet Commonly known as: Oxy IR/ROXICODONE Take 1 tablet (5 mg total) by mouth every 6 (six) hours as needed for severe pain.   silver sulfADIAZINE 1 % cream Commonly known as: SILVADENE Apply 1 application topically daily.  Follow-up Information     [facility]. Schedule an appointment as soon as possible for a visit.   Contact information: [address] [place] [address] [phone]        [facility] [place] Follow up on 01/13/2022.   Specialty: General Surgery Why: 01/13/21 at 11:30 am. Please arrive 30 minutes prior to your appointment for paperwork. Please bring a copy of your photo ID and insurance card. Contact information: [address] [place] [address] [phone]                 Signed: Dortha Schwalbe MD 12/20/2021, 9:44 AM

## 2021-12-20 NOTE — Progress Notes (Signed)
Ivs removed, belongings packed. Pt discharge gone over with use of interpreter, husband at bedside. All questions answered.

## 2021-12-20 NOTE — TOC Transition Note (Signed)
Transition of Care [facility]) - CM/SW Discharge Note   Patient Details  Name: Jasmine Vasquez MRN: [id number] Date of Birth: 10-Jul-1970  Transition of Care [facility]) CM/SW Contact:  Bess Kinds, RN Phone Number: [phone] 12/20/2021, 10:52 AM   Clinical Narrative:     Patient to transition home today. Discharge prescriptions sent to community pharmacy. Follow up surgical appointment on AVS per provider. Provided contact information for local clinic for patient to establish for PCP needs. No further TOC needs identified at this time.   Final next level of care: Home/Self Care Barriers to Discharge: No Barriers Identified   Patient Goals and CMS Choice        Discharge Placement                       Discharge Plan and Services                                     Social Determinants of Health (SDOH) Interventions     Readmission Risk Interventions     No data to display

## 2021-12-21 ENCOUNTER — Encounter (HOSPITAL_COMMUNITY): Payer: Self-pay | Admitting: General Surgery

## 2021-12-21 LAB — SURGICAL PATHOLOGY

## 2021-12-22 NOTE — Anesthesia Postprocedure Evaluation (Signed)
Anesthesia Post Note  Patient: Jasmine Vasquez  Procedure(s) Performed: LAPAROSCOPIC CHOLECYSTECTOMY     Patient location during evaluation: PACU Anesthesia Type: General Level of consciousness: awake and alert Pain management: pain level controlled Vital Signs Assessment: post-procedure vital signs reviewed and stable Respiratory status: spontaneous breathing, nonlabored ventilation, respiratory function stable and patient connected to nasal cannula oxygen Cardiovascular status: blood pressure returned to baseline and stable Postop Assessment: no apparent nausea or vomiting Anesthetic complications: no  No notable events documented.  Last Vitals:  Vitals:   12/20/21 0335 12/20/21 0805  BP: 105/74 114/74  Pulse: 78 81  Resp: 17 15  Temp: 37.1 C 36.5 C  SpO2: 97% 97%    Last Pain:  Vitals:   12/20/21 0805  TempSrc: Oral  PainSc:                  [name]

## 2021-12-31 LAB — GLUCOSE, CAPILLARY: Glucose-Capillary: 226 mg/dL — ABNORMAL HIGH (ref 70–99)

## 2022-01-01 LAB — GLUCOSE, CAPILLARY: Glucose-Capillary: 221 mg/dL — ABNORMAL HIGH (ref 70–99)

## 2023-03-16 ENCOUNTER — Encounter: Payer: Self-pay | Admitting: Physician Assistant

## 2023-03-23 ENCOUNTER — Telehealth: Payer: Self-pay

## 2023-03-23 NOTE — Telephone Encounter (Signed)
 Telephoned patient at mobile number using interpreter, Natale Lay. Left a voice message with [facility] (scholarship) contact information.

## 2023-03-24 ENCOUNTER — Other Ambulatory Visit: Payer: Self-pay | Admitting: Obstetrics & Gynecology

## 2023-03-24 DIAGNOSIS — Z1231 Encounter for screening mammogram for malignant neoplasm of breast: Secondary | ICD-10-CM

## 2023-05-02 ENCOUNTER — Ambulatory Visit: Payer: Self-pay | Admitting: Physician Assistant

## 2023-06-02 ENCOUNTER — Ambulatory Visit
Admission: RE | Admit: 2023-06-02 | Discharge: 2023-06-02 | Disposition: A | Discharge status: Home / Self Care | Source: Ambulatory Visit | Attending: Obstetrics & Gynecology | Admitting: Obstetrics & Gynecology

## 2023-06-02 DIAGNOSIS — Z1231 Encounter for screening mammogram for malignant neoplasm of breast: Secondary | ICD-10-CM

## 2023-06-22 ENCOUNTER — Ambulatory Visit (INDEPENDENT_AMBULATORY_CARE_PROVIDER_SITE_OTHER): Payer: Self-pay | Admitting: Gastroenterology

## 2023-06-22 ENCOUNTER — Encounter: Payer: Self-pay | Admitting: Gastroenterology

## 2023-06-22 ENCOUNTER — Other Ambulatory Visit

## 2023-06-22 VITALS — BP 100/80 | HR 80 | Ht 62.0 in | Wt 153.0 lb

## 2023-06-22 DIAGNOSIS — K219 Gastro-esophageal reflux disease without esophagitis: Secondary | ICD-10-CM

## 2023-06-22 DIAGNOSIS — R1012 Left upper quadrant pain: Secondary | ICD-10-CM

## 2023-06-22 DIAGNOSIS — K760 Fatty (change of) liver, not elsewhere classified: Secondary | ICD-10-CM

## 2023-06-22 DIAGNOSIS — Z1211 Encounter for screening for malignant neoplasm of colon: Secondary | ICD-10-CM

## 2023-06-22 DIAGNOSIS — Z9049 Acquired absence of other specified parts of digestive tract: Secondary | ICD-10-CM

## 2023-06-22 LAB — CBC WITH DIFFERENTIAL/PLATELET
Absolute Lymphocytes: 3158 {cells}/uL (ref 850–3900)
Absolute Monocytes: 583 {cells}/uL (ref 200–950)
Basophils Absolute: 19 {cells}/uL (ref 0–200)
Basophils Relative: 0.2 %
Eosinophils Absolute: 66 {cells}/uL (ref 15–500)
Eosinophils Relative: 0.7 %
HCT: 39 % (ref 35.0–45.0)
Hemoglobin: 13 g/dL (ref 11.7–15.5)
MCH: 31.4 pg (ref 27.0–33.0)
MCHC: 33.3 g/dL (ref 32.0–36.0)
MCV: 94.2 fL (ref 80.0–100.0)
MPV: 10.5 fL (ref 7.5–12.5)
Monocytes Relative: 6.2 %
Neutro Abs: 5574 {cells}/uL (ref 1500–7800)
Neutrophils Relative %: 59.3 %
Platelets: 280 10*3/uL (ref 140–400)
RBC: 4.14 10*6/uL (ref 3.80–5.10)
RDW: 13.5 % (ref 11.0–15.0)
Total Lymphocyte: 33.6 %
WBC: 9.4 10*3/uL (ref 3.8–10.8)

## 2023-06-22 LAB — COMPREHENSIVE METABOLIC PANEL WITH GFR
AG Ratio: 1.6 (calc) (ref 1.0–2.5)
ALT: 23 U/L (ref 6–29)
AST: 18 U/L (ref 10–35)
Albumin: 4.5 g/dL (ref 3.6–5.1)
Alkaline phosphatase (APISO): 65 U/L (ref 37–153)
BUN: 14 mg/dL (ref 7–25)
CO2: 25 mmol/L (ref 20–32)
Calcium: 9.6 mg/dL (ref 8.6–10.4)
Chloride: 105 mmol/L (ref 98–110)
Creat: 0.66 mg/dL (ref 0.50–1.03)
Globulin: 2.9 g/dL (ref 1.9–3.7)
Glucose, Bld: 146 mg/dL — ABNORMAL HIGH (ref 65–99)
Potassium: 4.2 mmol/L (ref 3.5–5.3)
Sodium: 138 mmol/L (ref 135–146)
Total Bilirubin: 1.4 mg/dL — ABNORMAL HIGH (ref 0.2–1.2)
Total Protein: 7.4 g/dL (ref 6.1–8.1)
eGFR: 106 mL/min/{1.73_m2} (ref >=60)

## 2023-06-22 MED ORDER — OMEPRAZOLE 20 MG PO CPDR: 20.0000 mg | DELAYED_RELEASE_CAPSULE | Freq: Every day | ORAL | 3 refills | Status: AC

## 2023-06-22 NOTE — Patient Instructions (Addendum)
 Your provider has requested that you go to the basement level for lab work before leaving today. Press B on the elevator. The lab is located at the first door on the left as you exit the elevator.  Your provider has ordered Diatherix stool testing for you. You have received a kit from our office today containing all necessary supplies to complete this test. Please carefully read the stool collection instructions provided in the kit before opening the accompanying materials. In addition, be sure there is a label providing your full name and date of birth on the puritan opti-swab tube that is supplied in the kit (if you do not see a label with this information on your test tube, please make us  aware before test collection!). After completing the test, you should secure the purtian tube into the specimen biohazard bag. The [facility] E-Req sheet (including date and time of specimen collection) should be placed into the outside pocket of the specimen biohazard bag and returned to the [facility] lab (basement floor of [facility]) within 3 days of collection. Please make sure to give the specimen to a staff member at the lab. DO NOT leave the specimen on the counter.   If the specimen date and time (can be found in the upper right boxed portion of the sheet) are not filled out on the E-Req sheet, the test will NOT be performed.    We have sent the following medications to your pharmacy for you to pick up at your convenience: Omeprazole 20 mg once daily.  _______________________________________________________  If your blood pressure at your visit was 140/90 or greater, please contact your primary care physician to follow up on this.  _______________________________________________________  If you are age 50 or older, your body mass index should be between 23-30. Your Body mass index is 27.98 kg/m. If this is out of the aforementioned range listed, please consider follow  up with your Primary Care Provider.  If you are age 74 or younger, your body mass index should be between 19-25. Your Body mass index is 27.98 kg/m. If this is out of the aformentioned range listed, please consider follow up with your Primary Care Provider.   ________________________________________________________  The [facility] providers would like to encourage you to use MYCHART to communicate with providers for non-urgent requests or questions.  Due to long hold times on the telephone, sending your provider a message by [employer] may be a faster and more efficient way to get a response.  Please allow 48 business hours for a response.  Please remember that this is for non-urgent requests.  _______________________________________________________   It was a pleasure to see you today!  Thank you for trusting me with your gastrointestinal care!

## 2023-06-22 NOTE — Addendum Note (Signed)
 Addended by: Darleene Ege on: 06/22/2023 09:45 AM   Modules accepted: Orders

## 2023-06-22 NOTE — Progress Notes (Signed)
 Chief Complaint: Hepatic steatosis Primary GI MD: Para Bold  HPI: Discussed the use of AI scribe software for clinical note transcription with the patient, who gave verbal consent to proceed.  Translator present-Spanish  History of Present Illness Jasmine Vasquez is a 53 year old female with fatty liver who presents with abdominal pain. She is accompanied by her husband. She was referred by her primary care physician for evaluation of fatty liver.  She has a history of fatty liver diagnosed in 2022. Her primary care physician intended to perform blood work to monitor her liver enzymes, but this was not completed during her last visit over a month ago. No recent lab work has been sent over by her primary care physician.  Last lab work we have available is from 2023 which shows normal CBC and mildly elevated AST  She has been experiencing abdominal pain for more than a month and a half. The pain is described as a 'closing feeling' on her side and does not worsen with eating or improve with bowel movements. She suspects that certain foods, particularly those high in oil, fat, or spiciness, might trigger the pain. The pain occurs a few times a week, not daily.  Occurs in the LUQ.  No changes in bowel habits, such as constipation or diarrhea, and normal bowel movements. No bleeding or black stools.  No previous colonoscopy  Past Medical History:  Diagnosis Date   Diabetes type 2 (HCC)    Gallstones    Gestational diabetes    third pregnancy   Hepatic steatosis    Hyperlipidemia    Hypertension     Past Surgical History:  Procedure Laterality Date   CHOLECYSTECTOMY N/A 12/18/2021   Procedure: LAPAROSCOPIC CHOLECYSTECTOMY;  Surgeon: Caralyn Chandler, MD;  Location: [facility] OR;  Service: General;  Laterality: N/A;    Current Outpatient Medications  Medication Sig Dispense Refill   amLODipine  (NORVASC ) 5 MG tablet Take 5 mg by mouth daily.     atenolol  (TENORMIN ) 50 MG tablet Take 50 mg by  mouth daily.     atorvastatin (LIPITOR) 40 MG tablet Take 20 mg by mouth at bedtime.     glipiZIDE (GLUCOTROL XL) 10 MG 24 hr tablet Take 10 mg by mouth 2 (two) times daily.     metFORMIN (GLUCOPHAGE) 1000 MG tablet Take 1,000 mg by mouth 2 (two) times daily with a meal.     omeprazole (PRILOSEC) 20 MG capsule Take 1 capsule (20 mg total) by mouth daily. 30 capsule 3   pioglitazone (ACTOS) 30 MG tablet Take 30 mg by mouth daily.     dicyclomine (BENTYL) 20 MG tablet Take 20 mg by mouth 3 (three) times daily as needed. (Patient not taking: Reported on 06/22/2023)     No current facility-administered medications for this visit.    Allergies as of 06/22/2023 - Review Complete 06/22/2023  Allergen Reaction Noted   Penicillins Other (See Comments) 10/20/2011    Family History  Problem Relation Age of Onset   Hypertension Father    Stroke Father    Diabetes Sister    Hypertension Brother    Diabetes Daughter     Social History   Socioeconomic History   Marital status: Married    Spouse name: Not on file   Number of children: 3   Years of education: Not on file   Highest education level: Not on file  Occupational History   Not on file  Tobacco Use   Smoking status: Never  Smokeless tobacco: Never  Vaping Use   Vaping status: Never Used  Substance and Sexual Activity   Alcohol use: No   Drug use: No   Sexual activity: Yes  Other Topics Concern   Not on file  Social History Narrative   Not on file   Social Drivers of Health   Financial Resource Strain: Not on file  Food Insecurity: No Food Insecurity (12/18/2021)   Hunger Vital Sign    Worried About Running Out of Food in the Last Year: Never true    Ran Out of Food in the Last Year: Never true  Transportation Needs: No Transportation Needs (12/18/2021)   PRAPARE - Administrator, Civil Service (Medical): No    Lack of Transportation (Non-Medical): No  Physical Activity: Not on file  Stress: Not on file   Social Connections: Unknown (05/26/2021)   [facility] from [facility], [facility]   Social Network    Social Network: Not on file  Intimate Partner Violence: Not At Risk (12/18/2021)   Humiliation, Afraid, Rape, and Kick questionnaire    Fear of Current or Ex-Partner: No    Emotionally Abused: No    Physically Abused: No    Sexually Abused: No    Review of Systems:    Constitutional: No weight loss, fever, chills, weakness or fatigue HEENT: Eyes: No change in vision               Ears, Nose, Throat:  No change in hearing or congestion Skin: No rash or itching Cardiovascular: No chest pain, chest pressure or palpitations   Respiratory: No SOB or cough Gastrointestinal: See HPI and otherwise negative Genitourinary: No dysuria or change in urinary frequency Neurological: No headache, dizziness or syncope Musculoskeletal: No new muscle or joint pain Hematologic: No bleeding or bruising Psychiatric: No history of depression or anxiety    Physical Exam:  Vital signs: BP 100/80 (BP Location: Left Arm, Patient Position: Sitting, Cuff Size: Normal)   Pulse 80   Ht 5' 2 (1.575 m) Comment: height measured without shoes  Wt 153 lb (69.4 kg)   Breastfeeding No   BMI 27.98 kg/m   Constitutional: NAD, alert and cooperative Head:  Normocephalic and atraumatic. Eyes:   PEERL, EOMI. No icterus. Conjunctiva pink. Respiratory: Respirations even and unlabored. Lungs clear to auscultation bilaterally.   No wheezes, crackles, or rhonchi.  Cardiovascular:  Regular rate and rhythm. No peripheral edema, cyanosis or pallor.  Gastrointestinal:  Soft, nondistended, nontender. No rebound or guarding. Normal bowel sounds. No appreciable masses or hepatomegaly. Rectal:  Declines Msk:  Symmetrical without gross deformities. Without edema, no deformity or joint abnormality.  Neurologic:  Alert and  oriented x4;  grossly normal neurologically.  Skin:   Dry and intact without significant lesions or  rashes. Psychiatric: Oriented to person, place and time. Demonstrates good judgement and reason without abnormal affect or behaviors.   RELEVANT LABS AND IMAGING: CBC    Component Value Date/Time   WBC 17.3 (H) 12/20/2021 0426   RBC 4.08 12/20/2021 0426   HGB 13.0 12/20/2021 0426   HCT 37.8 12/20/2021 0426   PLT 316 12/20/2021 0426   MCV 92.6 12/20/2021 0426   MCH 31.9 12/20/2021 0426   MCHC 34.4 12/20/2021 0426   RDW 13.1 12/20/2021 0426   LYMPHSABS 1.5 10/20/2011 0944   MONOABS 0.3 10/20/2011 0944   EOSABS 0.1 10/20/2011 0944   BASOSABS 0.0 10/20/2011 0944    CMP     Component Value Date/Time  NA 138 12/19/2021 0157   K 3.0 (L) 12/19/2021 0157   CL 108 12/19/2021 0157   CO2 13 (L) 12/19/2021 0157   GLUCOSE 211 (H) 12/19/2021 0157   GLUCOSE 115 (H) 10/29/2011 1133   BUN 5 (L) 12/19/2021 0157   CREATININE 0.72 12/19/2021 0157   CREATININE 0.42 (L) 11/08/2011 0950   CALCIUM 8.4 (L) 12/19/2021 0157   PROT 6.0 (L) 12/19/2021 0157   ALBUMIN 2.3 (L) 12/19/2021 0157   AST 47 (H) 12/19/2021 0157   ALT 40 12/19/2021 0157   ALKPHOS 87 12/19/2021 0157   BILITOT 1.1 12/19/2021 0157   GFRNONAA >60 12/19/2021 0157   GFRAA NOT CALCULATED 10/04/2010 1037     Assessment/Plan:   Hepatic steatosis Noted on CT 12/2021 and US  2022. Last labs 2023 with normal CBC and AST 47.  Patient is self-pay so workup is limited as it is cost prohibitive. - CBC/CMP - Calculate fib 4 score based on above and consider elastography - Of elevated LFTs consider serologic workup - Educated patient on hepatic steatosis and importance of diet, weight loss, controlling blood sugar - Provided patient education handout  LUQ pain LUQ pain and occasional GERD worsened with spicy/acidic foods.  Not currently on any antacid.  Likely GERD/gastritis. - Educated patient on lifestyle modifications and provided patient education handout - Omeprazole 20 Mg once daily - H. pylori Diatherix  Screening for  colon cancer No family history of colon cancer.  No previous colonoscopy.  No change in bowel habits, weight loss, rectal bleeding.  Patient is due for initial screening based on age but due to being self-pay colonoscopy is cost prohibitive for her at this time.  She would like to think about her options and let us  know - Provided patient with colon cancer screening information as well as patient education handouts - She will call us  back if she is interested in a colonoscopy  S/p cholecystectomy   Diabetes  Assigned to Dr. Elvin Hammer today (Wednesday)  Dorri Ozturk [facility] 06/22/2023, 9:42 AM  Cc: Hinojosa-Clapp, Marcela*

## 2023-06-22 NOTE — Progress Notes (Signed)
 Noted

## 2023-06-23 ENCOUNTER — Ambulatory Visit: Payer: Self-pay | Admitting: Gastroenterology

## 2023-06-27 ENCOUNTER — Telehealth: Payer: Self-pay | Admitting: Gastroenterology

## 2023-06-27 NOTE — Telephone Encounter (Signed)
 Patient/daughter advised of negative/normal H Pylori testing meaning we did not find H pylori bacteria as the cause of any of her symptoms.

## 2023-06-27 NOTE — Telephone Encounter (Signed)
 Negative h pylori diatherix
